# Patient Record
Sex: Female | Born: 1970 | Race: White | Hispanic: No | Marital: Married | Smoking: Never smoker
Health system: Southern US, Community
[De-identification: ages and names within clinical notes are randomized; demographics above are authoritative.]

## PROBLEM LIST (undated history)

## (undated) DIAGNOSIS — E785 Hyperlipidemia, unspecified: Secondary | ICD-10-CM

## (undated) HISTORY — PX: BREAST EXCISIONAL BIOPSY: SUR124

## (undated) HISTORY — DX: Hyperlipidemia, unspecified: E78.5

---

## 2002-05-17 ENCOUNTER — Other Ambulatory Visit: Admission: RE | Admit: 2002-05-17 | Discharge: 2002-05-17 | Payer: Self-pay | Admitting: Gynecology

## 2002-11-12 ENCOUNTER — Other Ambulatory Visit: Admission: RE | Admit: 2002-11-12 | Discharge: 2002-11-12 | Payer: Self-pay | Admitting: Gynecology

## 2003-11-26 ENCOUNTER — Other Ambulatory Visit: Admission: RE | Admit: 2003-11-26 | Discharge: 2003-11-26 | Payer: Self-pay | Admitting: Gynecology

## 2005-08-10 ENCOUNTER — Other Ambulatory Visit: Admission: RE | Admit: 2005-08-10 | Discharge: 2005-08-10 | Payer: Self-pay | Admitting: Gynecology

## 2006-08-14 ENCOUNTER — Other Ambulatory Visit: Admission: RE | Admit: 2006-08-14 | Discharge: 2006-08-14 | Payer: Self-pay | Admitting: Gynecology

## 2007-01-19 ENCOUNTER — Other Ambulatory Visit: Admission: RE | Admit: 2007-01-19 | Discharge: 2007-01-19 | Payer: Self-pay | Admitting: Gynecology

## 2007-12-17 ENCOUNTER — Inpatient Hospital Stay (HOSPITAL_COMMUNITY): Admission: AD | Admit: 2007-12-17 | Discharge: 2007-12-19 | Payer: Self-pay | Admitting: Obstetrics and Gynecology

## 2009-02-16 ENCOUNTER — Encounter: Admission: RE | Admit: 2009-02-16 | Discharge: 2009-02-16 | Payer: Self-pay | Admitting: Obstetrics and Gynecology

## 2009-04-24 ENCOUNTER — Ambulatory Visit (HOSPITAL_COMMUNITY): Admission: RE | Admit: 2009-04-24 | Discharge: 2009-04-24 | Payer: Self-pay | Admitting: Obstetrics and Gynecology

## 2010-11-17 ENCOUNTER — Encounter
Admission: RE | Admit: 2010-11-17 | Discharge: 2010-11-17 | Payer: Self-pay | Source: Home / Self Care | Attending: Obstetrics and Gynecology | Admitting: Obstetrics and Gynecology

## 2011-02-09 ENCOUNTER — Encounter (HOSPITAL_COMMUNITY)
Admission: RE | Admit: 2011-02-09 | Discharge: 2011-02-09 | Disposition: A | Discharge status: Home / Self Care | Payer: BC Managed Care – PPO | Source: Ambulatory Visit | Attending: Obstetrics and Gynecology | Admitting: Obstetrics and Gynecology

## 2011-02-09 LAB — SURGICAL PCR SCREEN: MRSA, PCR: NEGATIVE

## 2011-02-14 ENCOUNTER — Inpatient Hospital Stay (HOSPITAL_COMMUNITY)
Admission: RE | Admit: 2011-02-14 | Discharge: 2011-02-18 | DRG: 371 | Disposition: A | Discharge status: Home / Self Care | Payer: BC Managed Care – PPO | Source: Ambulatory Visit | Attending: Obstetrics and Gynecology | Admitting: Obstetrics and Gynecology

## 2011-02-14 DIAGNOSIS — Z302 Encounter for sterilization: Secondary | ICD-10-CM

## 2011-02-14 DIAGNOSIS — O99892 Other specified diseases and conditions complicating childbirth: Principal | ICD-10-CM | POA: Diagnosis present

## 2011-02-14 DIAGNOSIS — O09529 Supervision of elderly multigravida, unspecified trimester: Secondary | ICD-10-CM | POA: Diagnosis present

## 2011-02-15 LAB — CBC
HCT: 29.1 % — ABNORMAL LOW (ref 36.0–46.0)
Hemoglobin: 9.4 g/dL — ABNORMAL LOW (ref 12.0–15.0)
MCH: 28.4 pg (ref 26.0–34.0)
MCHC: 32.3 g/dL (ref 30.0–36.0)
MCV: 87.9 fL (ref 78.0–100.0)
RBC: 3.31 MIL/uL — ABNORMAL LOW (ref 3.87–5.11)

## 2011-02-20 NOTE — H&P (Signed)
  NAMEPETRINA, Tammy Powell              ACCOUNT NO.:  [id number]  MEDICAL RECORD NO.:  [id number]           PATIENT TYPE:  I  LOCATION:  9127                          FACILITY:  [facility]  PHYSICIAN:  Dineen Kid. Rana Snare, M.D.    DATE OF BIRTH:  [birth year]  DATE OF ADMISSION:  02/14/2011 DATE OF DISCHARGE:                             HISTORY & PHYSICAL   HISTORY OF PRESENT ILLNESS:  Ms. Tammy Powell is a 40 year old, G2, P2.  She is at 39+ weeks' gestational age.  She presents for primary cesarean section.  Her pregnancy has been complicated by advanced maternal age with a normal first trimester screen, declined amniocentesis.  She had normal alpha-fetoprotein.  Her estimated date of confinement is Finn 2, 2012.  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  That is why she desires primary cesarean section.  She also has multiparity and desires sterility.  PAST MEDICAL HISTORY:  Negative.  PAST SURGICAL HISTORY:  She had an episiotomy revision of the rectovaginal fistula repair.  MEDICATIONS:  Prenatal vitamins.  ALLERGIES:  She has no known drug allergies.  PHYSICAL EXAMINATION:  VITAL SIGNS:  Her blood pressure is 120/60. HEART:  Regular rate and rhythm. LUNGS:  Clear to auscultation bilaterally. ABDOMEN:  Gravid, nontender.  Cervix is fingertip, 50%, -3 station.  IMPRESSION AND PLAN:  Intrauterine pregnancy at 39+ weeks' gestational age, previous traumatic vaginal delivery with episiotomy revision, rectovaginal fistula repair, also multiparity and desires sterility.  PLAN:  Primary low segment transverse cesarean section and bilateral tubal ligation.  Risks and benefits were discussed at length.  Informed consent was obtained.     Dineen Kid Rana Snare, M.D.     DCL/MEDQ  D:  02/14/2011  T:  02/15/2011  Job:  [id number]  Electronically Signed by Candice Camp M.D. on  02/20/2011 11:31:55 AM

## 2011-02-20 NOTE — Op Note (Signed)
NAMEKILA, GODINA              ACCOUNT NO.:  [id number]  MEDICAL RECORD NO.:  [id number]           PATIENT TYPE:  I  LOCATION:  9127                          FACILITY:  [facility]  PHYSICIAN:  Dineen Kid. Rana Snare, M.D.    DATE OF BIRTH:  1970/12/04  DATE OF PROCEDURE:  02/14/2011 DATE OF DISCHARGE:                              OPERATIVE REPORT   PREOPERATIVE DIAGNOSES: 1. Intrauterine pregnancy at 52 plus weeks' gestational age. 2. Previous episiotomy revision and rectovaginal fistula repair. 3. Multiparity, desires sterility.  POSTOPERATIVE DIAGNOSES: 1. Intrauterine pregnancy at 4 plus weeks' gestational age. 2. Previous episiotomy revision and rectovaginal fistula repair. 3. Multiparity, desires sterility.  PROCEDURES: 1. Primary low segment transverse cesarean section. 2. Bilateral tubal ligation with Filshie clip.  SURGEON:  Dineen Kid. Rana Snare, MD  ANESTHESIA:  Spinal.  INDICATIONS:  Ms. Stetzer is a 40 year old G2, P1 at 57+ weeks' gestational age.  Pregnancy was uncomplicated except for the history of previous traumatic vaginal delivery, requiring repair.  She was told not to have any further vaginal trauma or vaginal deliveries and she presents for primary cesarean section for this.  She has multiparity and desires sterility.  Risks and benefits of procedure were discussed at length which include but not limited to risk of infection, bleeding, damage to the uterus, tubes, ovary, bowel, bladder, fetus, risk associated with anesthesia with blood transfusion, also tubal failure quoted 03/999.  She gives informed consent and wished to proceed.  FINDINGS:  At the time of surgery, viable female infant, Apgars of 9 and 9, pH arterial 7.16, weight was 8 pounds and 14 ounces.  ESTIMATED BLOOD LOSS:  700 mL.  DESCRIPTION OF PROCEDURE:  After adequate analgesia, the patient was placed in the supine position with left lateral tilt.  She was sterilely prepped and draped.  Bladder was  sterilely drained with a Foley catheter.  A Pfannenstiel skin incision was made 2 fingerbreadths above pubic symphysis, taken down sharply to fascia which was incised transversely and extended superiorly and inferiorly off the bellies of rectus muscle which were separated sharply in the midline.  Peritoneum was entered sharply.  Bladder flap was created and placed behind the bladder blade.  A low-segment myotomy incision was made down to the amniotic sac, extended laterally with the operator's fingertips. Amniotomy was performed and the infant's vertex was delivered atraumatically.  The nares and pharynx were suctioned.  Infant delivered, cord clamped and cut, and handed to the pediatrician for resuscitation.  Cord blood was obtained.  Placenta extracted manually. Uterus was exteriorized, wiped clean with a dry lap.  The myotomy incision was closed in 2 layers, first being a running locking layer, the second being imbricating layer of 0 Monocryl suture.  Uterus was placed back in the abdominal cavity.  Midline incision line was encountered and a figure-of-eight of 0 Monocryl suture was placed.  Good hemostasis was noted. Bilateral fallopian tubes were identified by the fimbriated ends and a midportion of the tube was grasped and a filshie clip was place across the tubes with good placement noted.  This was done bilateral with good results noted.  After copious amount of irrigation, adequate hemostasis was assured.  The peritoneum was closed with 0 Monocryl suture.  Rectus muscle was plicated in the midline.  Irrigation was applied and after adequate hemostasis, the fascia was closed with a #1 Vicryl in a running fashion.  Irrigation was applied.  After adequate hemostasis, skin stapled, Steri-Strips applied.  The patient tolerated the procedure well and was stable on transfer to recovery room.  Sponge and instrument count was normal x3.  Estimated blood loss 700 mL.  The patient received 1  g of cefotetan preoperatively.     Dineen Kid Rana Snare, M.D.     DCL/MEDQ  D:  02/14/2011  T:  02/15/2011  Job:  [id number]  Electronically Signed by Candice Camp M.D. on 02/20/2011 11:33:40 AM

## 2011-02-21 ENCOUNTER — Inpatient Hospital Stay (HOSPITAL_COMMUNITY): Admission: AD | Admit: 2011-02-21 | Payer: Self-pay | Admitting: Obstetrics and Gynecology

## 2011-02-28 LAB — CBC
HCT: 40.8 % (ref 36.0–46.0)
Hemoglobin: 14.4 g/dL (ref 12.0–15.0)
MCV: 88.2 fL (ref 78.0–100.0)
Platelets: 309 10*3/uL (ref 150–400)
RDW: 12.6 % (ref 11.5–15.5)

## 2011-02-28 LAB — PREGNANCY, URINE: Preg Test, Ur: NEGATIVE

## 2011-04-05 NOTE — Op Note (Signed)
Tammy Powell, Tammy Powell              ACCOUNT NO.:  [id number]   MEDICAL RECORD NO.:  [id number]          PATIENT TYPE:  AMB   LOCATION:  [facility]C                           FACILITY:  [facility]   PHYSICIAN:  Dineen Kid. Rana Snare, M.D.    DATE OF BIRTH:  [birth year]   DATE OF PROCEDURE:  04/24/2009  DATE OF DISCHARGE:                               OPERATIVE REPORT   PREOPERATIVE DIAGNOSES:  Pelvic pain, potential rectovaginal fistula and  attenuated levator muscles.   POSTOPERATIVE DIAGNOSES:  Pelvic pain, potential rectovaginal fistula  and attenuated levator muscles.   PROCEDURE:  Episiotomy revision with posterior colporrhaphy and  perineorrhaphy.   SURGEON:  Dineen Kid. Rana Snare, MD   ANESTHESIA:  General endotracheal.   INDICATIONS:  Tammy Powell is a 40 year old, G1, P1, status post vaginal  delivery a little over a year ago with a fourth-degree episiotomy.  Over  the last year, she has continued to have pelvic pain to the point that  it precludes intercourse.  She also describes air coming from the  vagina, but no evidence of stool.  She underwent radiological evaluation  in search of a rectovaginal fistula which did not show any evidence of  rectovaginal fistula.  She also has poor rectal levator muscular support  of the rectum.  She desires definitive surgical intervention.  Plan to  proceed with episiotomy revision, possible excision of rectovaginal  fistula, and perineorrhaphy.  The risks and benefits of the procedure  were discussed at length which include but not limited to risk of  infection, bleeding, damage to the bowel, bladder, rectum, perineum,  vagina, ovaries, uterus.  Possibly, this may not alleviate the pain, it  could recur or worsen, we may not be able to identify the facial, it  could recur or worsen, risks associated with anesthesia and blood  transfusion were also discussed, and she gives her informed consent.   FINDINGS AT TIME OF SURGERY:  No evidence of rectovaginal fistula.   She  does have very attenuated rectal muscles and also support of the rectum  consistent with a rectocele.   DESCRIPTION OF PROCEDURE:  After adequate analgesia, the patient was  placed in the dorsal lithotomy position.  She was sterilely prepped and  draped.  Bladder sterilely drained.  The perineum was grasped with 2  Allis clamps at the edge of the introitus.  Triangular flap of skin was  incised and removed from the perineum.  The posterior vaginal mucosa was  undermined with Metzenbaum scissors and dissected approximately 4 cm up  into the vagina to the end of the scar tissue where the episiotomy and  the tissue was reflected laterally removing the previous scar tissue and  achieving hemostasis with Bovie cautery.  The operator's left finger was  placed in the rectum and careful examination of the rectal mucosa while  pushing it into the vagina did not show any evidence of fistula or  disruption of the mucosa.  It was very attenuated and thin with very  poor perirectal support.  A 4-0 Vicryl Rapide was used to plicate the  perirectal fascia in a  pursestring fashion over the rectum.  This was  done with multiple pursestrings of 4-0 Vicryl Rapide giving good  perirectal and rectal mucosal support.  The rectal fascia was then  plicated in the midline using 0 Monocryl suture figure-of-eights.  The  levator muscle and the superficial transverse perinei muscle were  identified and reapproximated with 2 sutures of 0 Monocryl suture with  good support noted.  The excess vaginal mucosa was trimmed from the  posterior rectum.  The vagina was then closed with a 2-0 running suture  of Vicryl Rapide with good approximation and good support noted.  The  perineum was then closed after plicating the superficial transverse  perinei muscle with the 0 Monocryl suture.  The perineal skin was then  closed with a 2-0 Vicryl Rapide.  Good approximation and good support  noted.  Reexamination with the  operator's fingers revealed good  posterior support.  No evidence of contraction of the scar tissue.  Rectovaginal reveals good rectal tone and no evidence of sutures into  the rectum.  The patient was then transferred to the recovery room in  stable condition.  Sponge and needle count was normal x3.  Estimated  blood loss was minimal.  The patient received 1 g of cefotetan  preoperatively.   DISPOSITION:  The patient will be discharged home to follow up in the  office 2-3 weeks with a routine instruction sheet.  Told to return for  increased pain, fever, or bleeding.  She has a prescription of Vicodin  at home, to use ice for the next 24-48 hours.      Dineen Kid Rana Snare, M.D.  Electronically Signed     DCL/MEDQ  D:  04/24/2009  T:  04/24/2009  Job:  [id number]

## 2011-08-12 LAB — CBC
Hemoglobin: 13.8
MCHC: 35
MCHC: 35.1
MCV: 86.9
Platelets: 225
Platelets: 267
RDW: 14.2
WBC: 14.6 — ABNORMAL HIGH

## 2019-01-03 ENCOUNTER — Other Ambulatory Visit: Payer: Self-pay | Admitting: Obstetrics and Gynecology

## 2019-01-03 DIAGNOSIS — N6320 Unspecified lump in the left breast, unspecified quadrant: Secondary | ICD-10-CM

## 2019-01-11 ENCOUNTER — Ambulatory Visit
Admission: RE | Admit: 2019-01-11 | Discharge: 2019-01-11 | Disposition: A | Discharge status: Home / Self Care | Payer: BC Managed Care – PPO | Source: Ambulatory Visit | Attending: Obstetrics and Gynecology | Admitting: Obstetrics and Gynecology

## 2019-01-11 ENCOUNTER — Other Ambulatory Visit: Payer: Self-pay | Admitting: Obstetrics and Gynecology

## 2019-01-11 DIAGNOSIS — R2232 Localized swelling, mass and lump, left upper limb: Secondary | ICD-10-CM

## 2019-01-11 DIAGNOSIS — N6320 Unspecified lump in the left breast, unspecified quadrant: Secondary | ICD-10-CM

## 2019-01-18 ENCOUNTER — Other Ambulatory Visit: Payer: Self-pay | Admitting: Obstetrics and Gynecology

## 2019-01-18 ENCOUNTER — Ambulatory Visit
Admission: RE | Admit: 2019-01-18 | Discharge: 2019-01-18 | Disposition: A | Discharge status: Home / Self Care | Payer: BC Managed Care – PPO | Source: Ambulatory Visit | Attending: Obstetrics and Gynecology | Admitting: Obstetrics and Gynecology

## 2019-01-18 DIAGNOSIS — R2232 Localized swelling, mass and lump, left upper limb: Secondary | ICD-10-CM

## 2019-10-09 ENCOUNTER — Other Ambulatory Visit: Payer: Self-pay

## 2019-10-09 DIAGNOSIS — Z20822 Contact with and (suspected) exposure to covid-19: Secondary | ICD-10-CM

## 2019-10-10 LAB — NOVEL CORONAVIRUS, NAA: SARS-CoV-2, NAA: NOT DETECTED

## 2019-12-23 IMAGING — US US AXILLARY NODE CORE BIOPSY LEFT
1 series · 12 of 12 positions shown · non-contrast
Comparison: Previous exam(s).
COMPARISON: Previous exam(s).
COMPARISON: Previous exam(s).

Addendum:
CLINICAL DATA: Ultrasound-guided biopsy was recommended of 2 areas
within the patient's palpable left axillary mass.

This report describes the biopsy of the more superior component of
the mass.
EXAM:
US AXILLARY NODE CORE BIOPSY LEFT

[Series 1: us axillary node core biopsy left · 0.05mm/px · 12 of 12 slices shown]
[im 1/12]
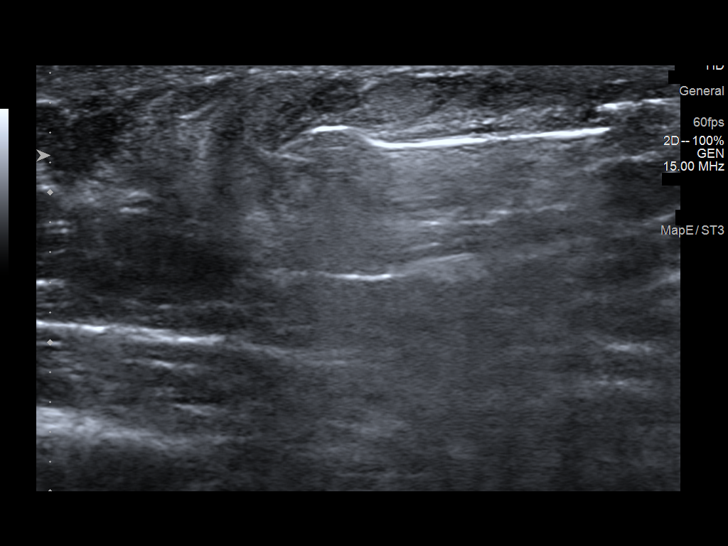
[im 2/12]
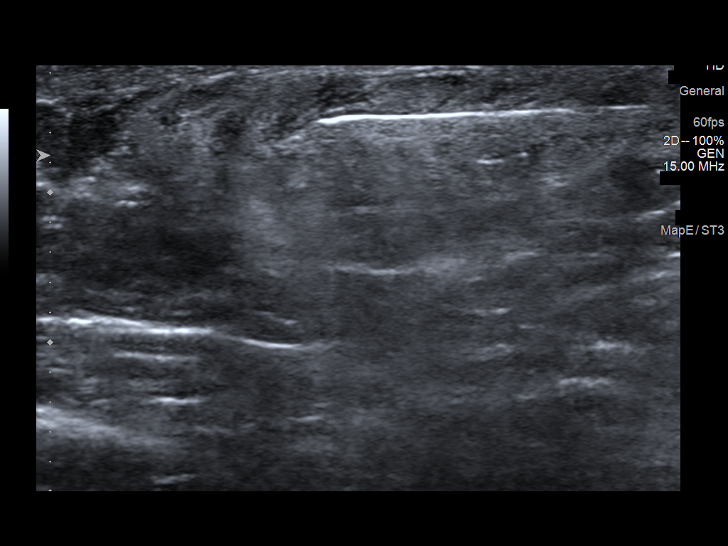
[im 3/12]
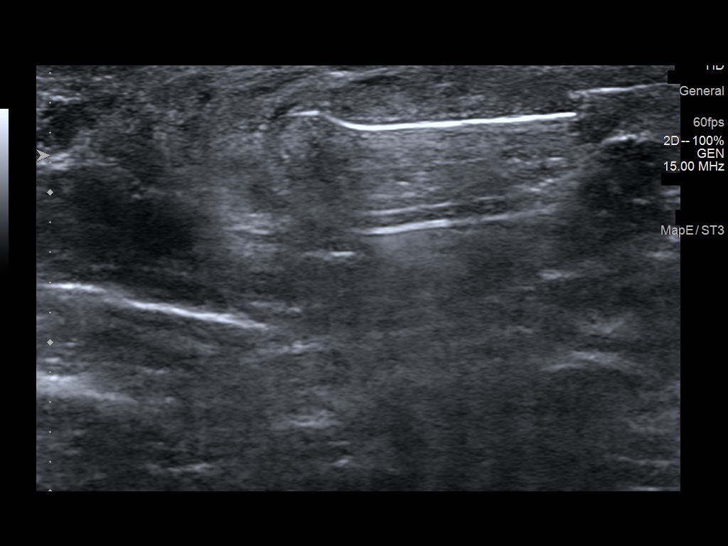
[im 4/12]
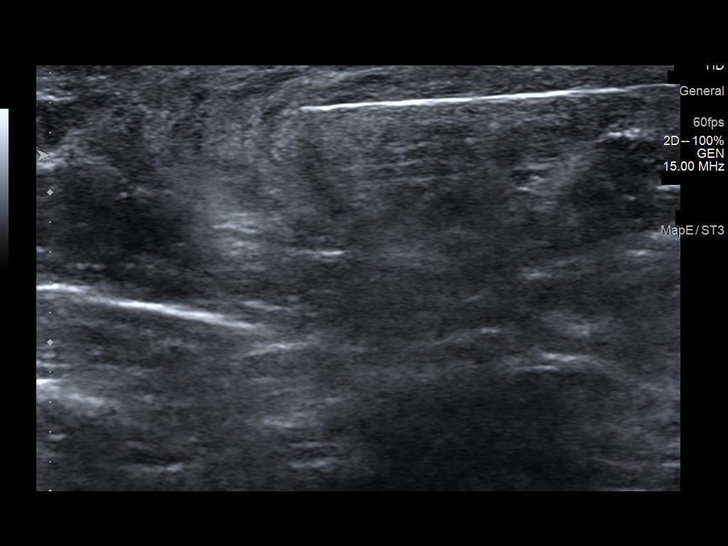
[im 5/12]
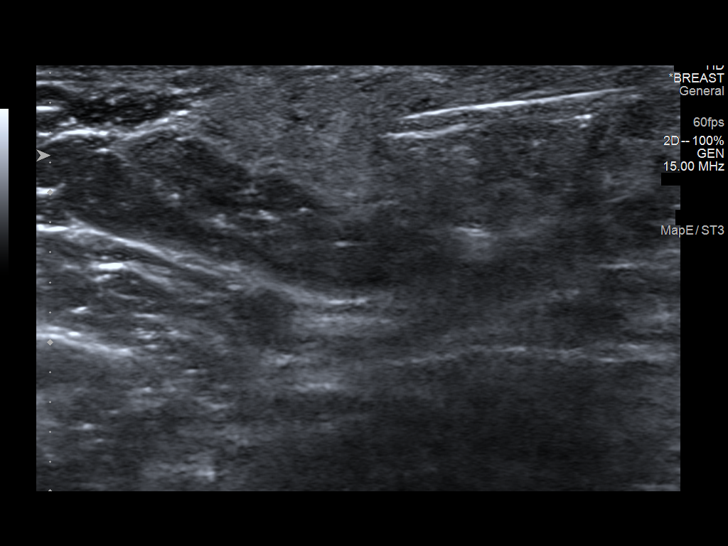
[im 6/12]
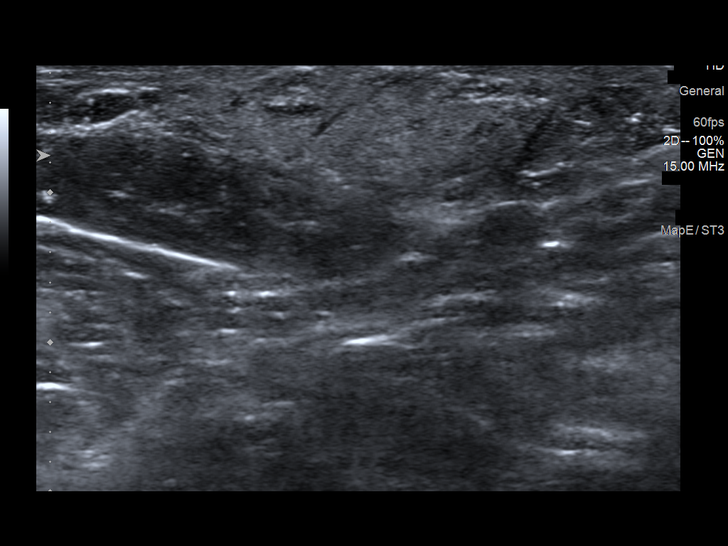
[im 7/12]
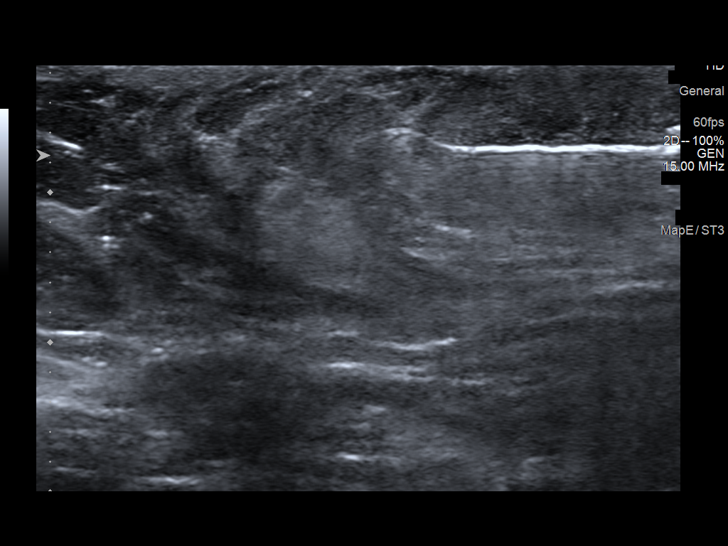
[im 8/12]
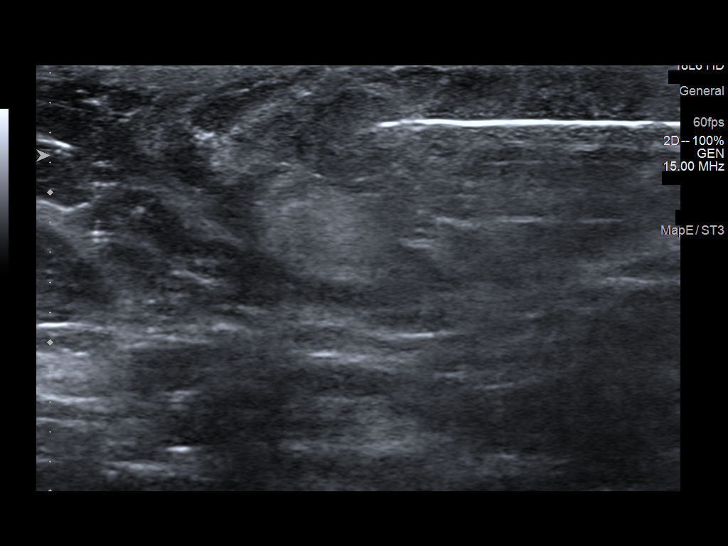
[im 9/12]
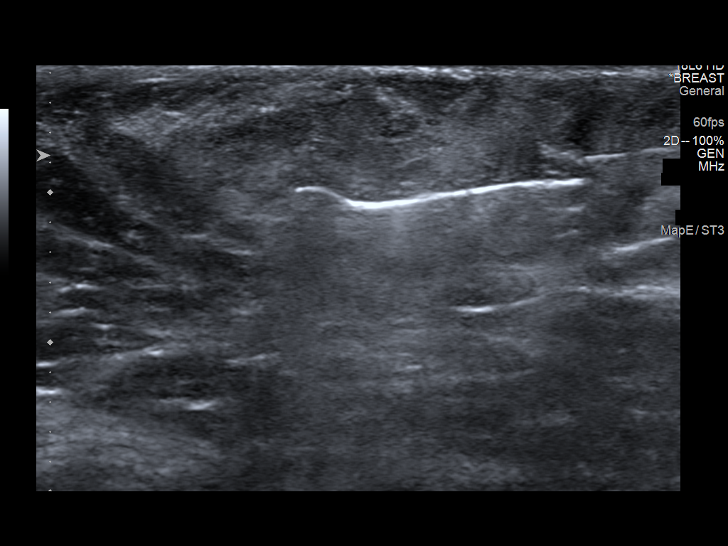
[im 10/12]
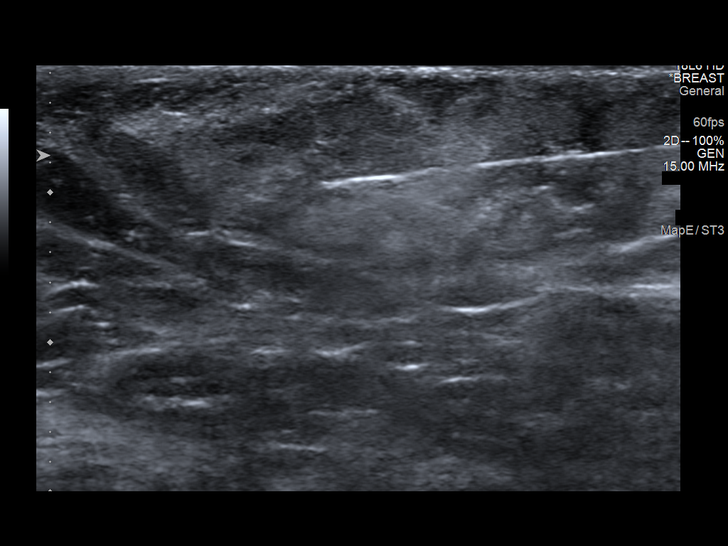
[im 11/12]
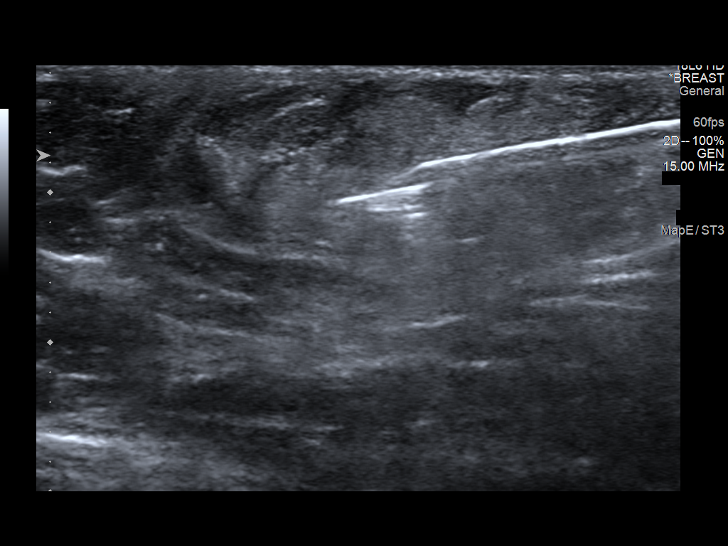
[im 12/12]
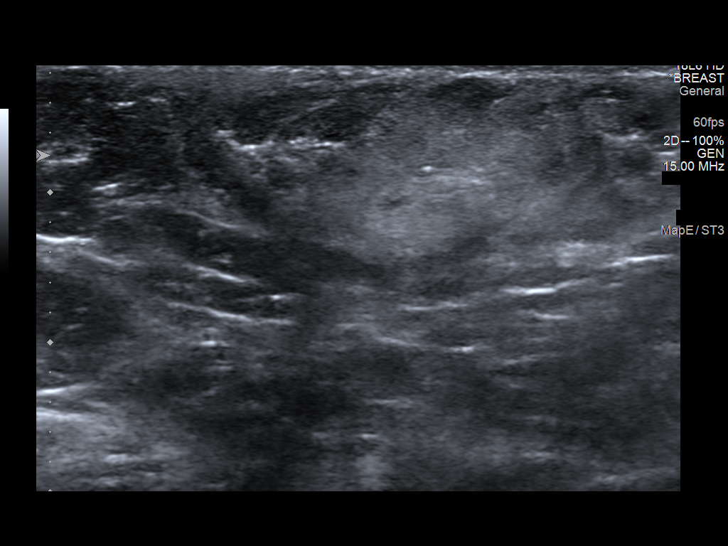

[12 of 12 positions shown; findings below may reference images not displayed]



Using sterile technique and 1% Lidocaine as local anesthetic, under
direct ultrasound visualization, a 14 gauge Jumper device was
used to perform biopsy of the superior portion of the palpable left
axillary mass using a lateral approach. At the conclusion of the
procedure a HydroMARK tissue marker clip was deployed into the
biopsy cavity.
IMPRESSION: Ultrasound guided biopsy of left axilla (superior portion of the
mass). No apparent complications.

ADDENDUM:
Pathology revealed BENIGN BREAST TISSUE WITH FOCAL PSEUDOANGIOMATOUS
STROMAL HYPERPLASIA (PASH) of the mid Left axilla (superior portion
of mass).

BENIGN ADIPOSE TISSUE of the mid Left axilla (inferior portion of
mass). This was found to be concordant by Dr. Nomasibulele Moatshe.

Pathology results were discussed with the patient by telephone. The
patient reported doing well after the biopsies with tenderness,
bruising and minimal bleeding at the sites. Post biopsy instructions
and care were reviewed and questions were answered. The patient was
encouraged to call The [REDACTED] for any
additional concerns.

The patient was instructed to return for annual screening

Pathology results reported by Zvjezdanam Nemanic, RN on 01/21/2019.

*** End of Addendum ***
Addendum:
FINDINGS: I met with the patient and we discussed the procedure of
ultrasound-guided biopsy, including benefits and alternatives. We
discussed the high likelihood of a successful procedure. We
discussed the risks of the procedure, including infection, bleeding,
tissue injury, clip migration, and inadequate sampling. Informed
written consent was given. The usual time-out protocol was performed
immediately prior to the procedure.

Using sterile technique and 1% Lidocaine as local anesthetic, under
direct ultrasound visualization, a 14 gauge Jumper device was
used to perform biopsy of the superior portion of the palpable left
axillary mass using a lateral approach. At the conclusion of the
procedure a HydroMARK tissue marker clip was deployed into the
biopsy cavity.
IMPRESSION: Ultrasound guided biopsy of left axilla (superior portion of the
mass). No apparent complications.

ADDENDUM:
Pathology revealed BENIGN BREAST TISSUE WITH FOCAL PSEUDOANGIOMATOUS
STROMAL HYPERPLASIA (PASH) of the mid Left axilla (superior portion
of mass).

BENIGN ADIPOSE TISSUE of the mid Left axilla (inferior portion of
mass). This was found to be concordant by Dr. Nomasibulele Moatshe.

Pathology results were discussed with the patient by telephone. The
patient reported doing well after the biopsies with tenderness,
bruising and minimal bleeding at the sites. Post biopsy instructions
and care were reviewed and questions were answered. The patient was
encouraged to call The [REDACTED] for any
additional concerns.

The patient was instructed to return for annual screening

Pathology results reported by Zvjezdanam Nemanic, RN on 01/21/2019.



Using sterile technique and 1% Lidocaine as local anesthetic, under
direct ultrasound visualization, a 14 gauge Jumper device was
used to perform biopsy of the superior portion of the palpable left
axillary mass using a lateral approach. At the conclusion of the
procedure a HydroMARK tissue marker clip was deployed into the
biopsy cavity.
IMPRESSION: Ultrasound guided biopsy of left axilla (superior portion of the
mass). No apparent complications.

## 2020-01-18 ENCOUNTER — Ambulatory Visit: Payer: Self-pay | Attending: Internal Medicine

## 2020-01-18 DIAGNOSIS — Z23 Encounter for immunization: Secondary | ICD-10-CM | POA: Insufficient documentation

## 2020-01-18 NOTE — Progress Notes (Signed)
   Covid-19 Vaccination Clinic  Name:  Tammy Powell    MRN: [id number] DOB: Aug 02, 1971  01/18/2020  [name] was observed post Covid-19 immunization for 15 minutes without incidence. She was provided with Vaccine Information Sheet and instruction to access the V-Safe system.   [name] was instructed to call 911 with any severe reactions post vaccine: Marland Kitchen Difficulty breathing  . Swelling of your face and throat  . A fast heartbeat  . A bad rash all over your body  . Dizziness and weakness    Immunizations Administered    Name Date Dose VIS Date Route   Pfizer COVID-19 Vaccine 01/18/2020  9:48 AM 0.3 mL 11/01/2019 Intramuscular   Manufacturer: [employer], [employer]   Lot: [id number]   NDC: [address]-3

## 2020-02-08 ENCOUNTER — Ambulatory Visit: Payer: Self-pay | Attending: Internal Medicine

## 2020-02-08 DIAGNOSIS — Z23 Encounter for immunization: Secondary | ICD-10-CM

## 2020-02-08 NOTE — Progress Notes (Signed)
   Covid-19 Vaccination Clinic  Name:  Tammy Powell    MRN: [id number] DOB: 09/27/1971  02/08/2020  [name] was observed post Covid-19 immunization for 15 minutes without incident. She was provided with Vaccine Information Sheet and instruction to access the V-Safe system.   [name] was instructed to call 911 with any severe reactions post vaccine: Marland Kitchen Difficulty breathing  . Swelling of face and throat  . A fast heartbeat  . A bad rash all over body  . Dizziness and weakness   Immunizations Administered    Name Date Dose VIS Date Route   Pfizer COVID-19 Vaccine 02/08/2020 12:23 PM 0.3 mL 11/01/2019 Intramuscular   Manufacturer: [employer], [employer]   Lot: [id number]   NDC: [address]-3

## 2020-02-12 ENCOUNTER — Ambulatory Visit: Payer: Self-pay

## 2020-04-01 ENCOUNTER — Other Ambulatory Visit: Payer: Self-pay | Admitting: Internal Medicine

## 2020-04-01 DIAGNOSIS — Z8249 Family history of ischemic heart disease and other diseases of the circulatory system: Secondary | ICD-10-CM

## 2020-04-09 ENCOUNTER — Ambulatory Visit: Payer: Self-pay

## 2020-04-28 ENCOUNTER — Ambulatory Visit
Admission: RE | Admit: 2020-04-28 | Discharge: 2020-04-28 | Disposition: A | Discharge status: Home / Self Care | Payer: Self-pay | Source: Ambulatory Visit | Attending: Internal Medicine | Admitting: Internal Medicine

## 2020-04-28 DIAGNOSIS — Z8249 Family history of ischemic heart disease and other diseases of the circulatory system: Secondary | ICD-10-CM

## 2021-04-02 IMAGING — CT CT CARDIAC CORONARY ARTERY CALCIUM SCORE
3 series · 14 of 20 positions shown, 16 images · non-contrast
Comparison: None.

CLINICAL DATA: Family history

EXAM:
CT CARDIAC CORONARY ARTERY CALCIUM SCORE
TECHNIQUE: Non-contrast imaging through the heart was performed using
prospective ECG gating. Image post processing was performed on an
independent workstation, allowing for quantitative analysis of the
heart and coronary arteries. Note that this exam targets the heart
and the chest was not imaged in its entirety.

[Series 2: calcium scoring 2.00 qr36 bestdiast 70% hrt calciu · axial · 0.34mm/px · z∈[+1489,+1585]mm · 4 of 80 slices shown]
[im 16/80  vessel]
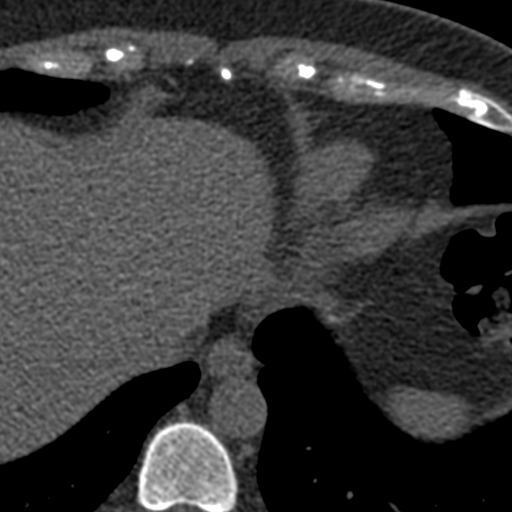
[im 32/80  vessel]
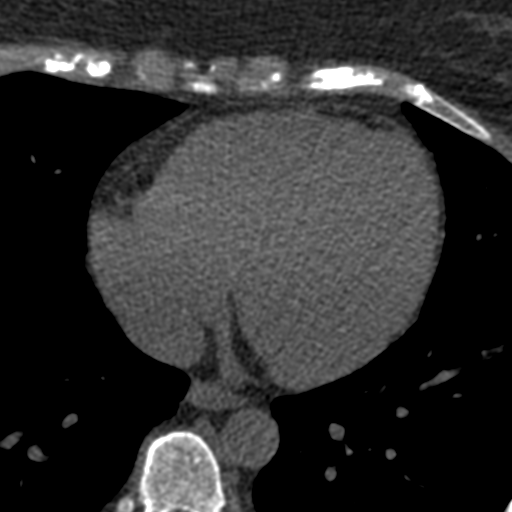
[im 48/80  vessel]
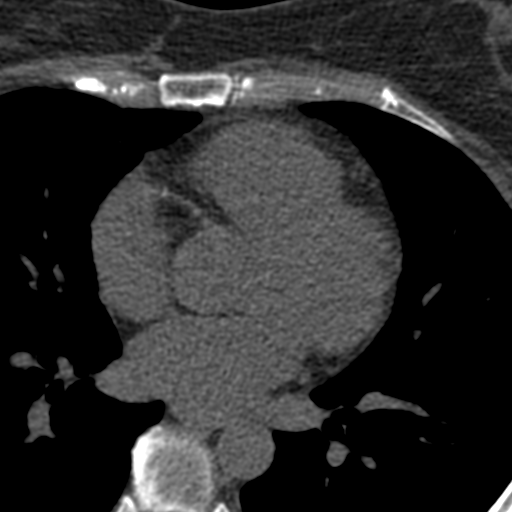
[im 64/80  vessel]
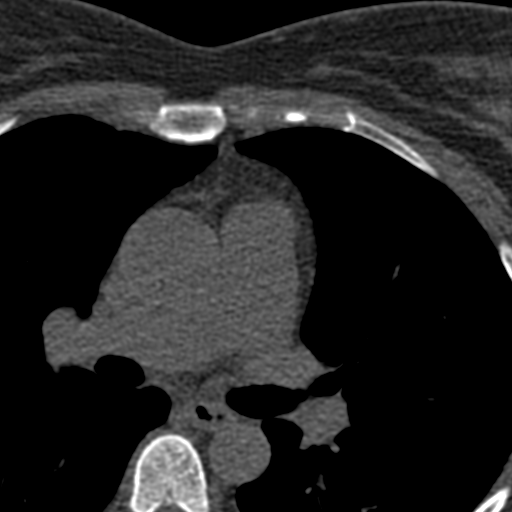

[Series 3: calcium scoring 2.00 br40 bestdiast 70% axial · axial · 0.51mm/px · z∈[+1485,+1589]mm · 5 of 80 slices shown, 7 images]
[im 14/80  vessel]
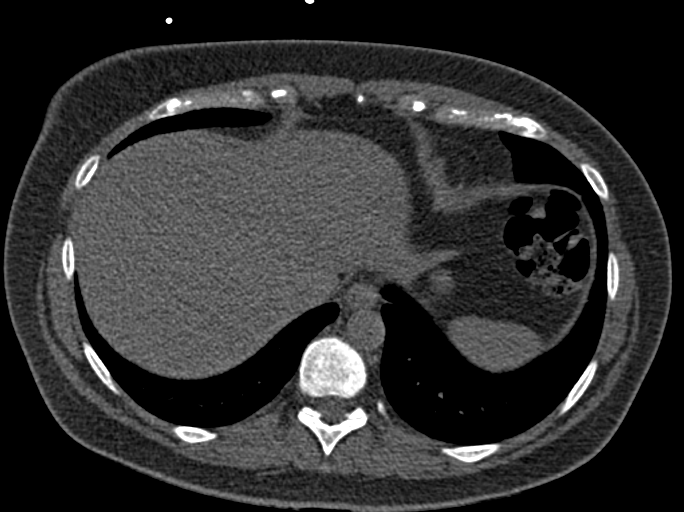
[im 14/80  lung]
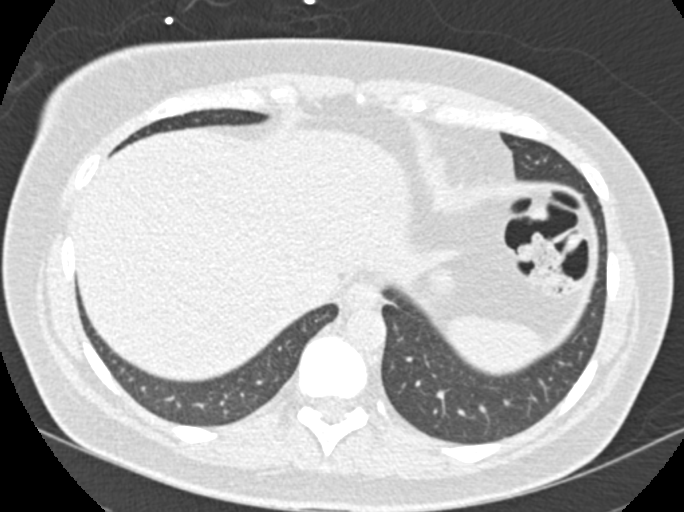
[im 27/80  vessel]
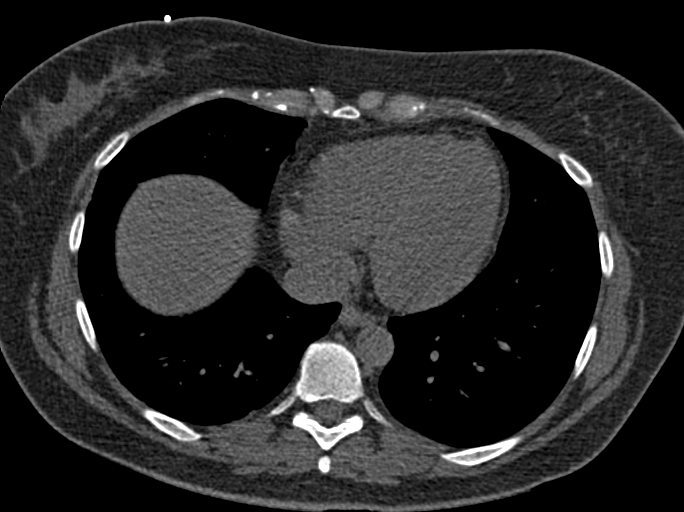
[im 40/80  vessel]
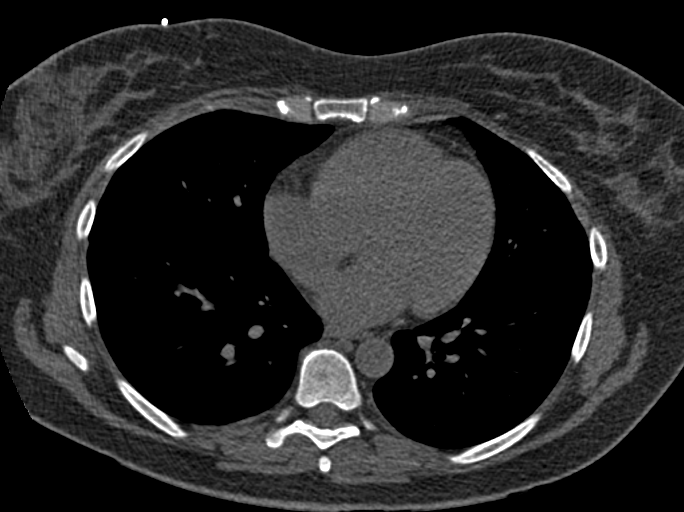
[im 53/80  vessel]
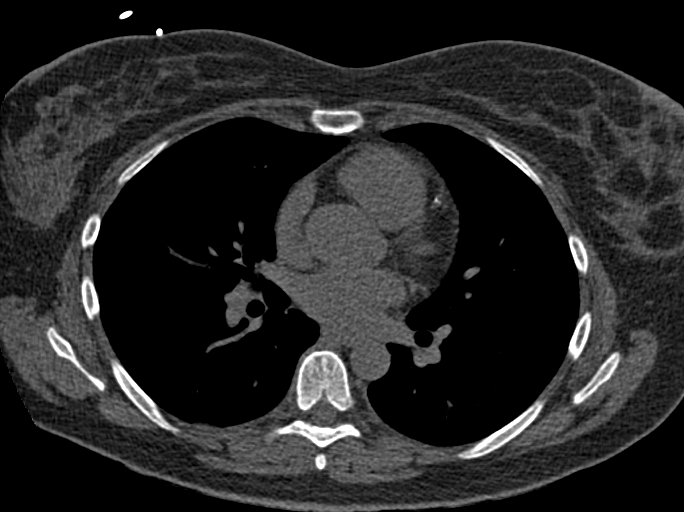
[im 66/80  vessel]
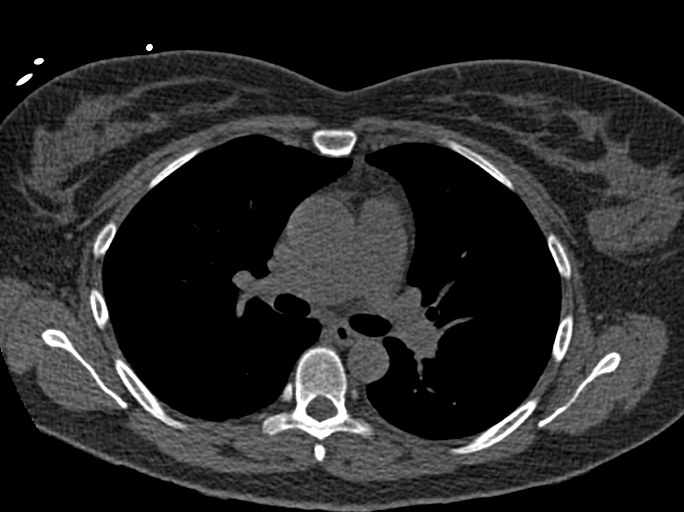
[im 66/80  lung]
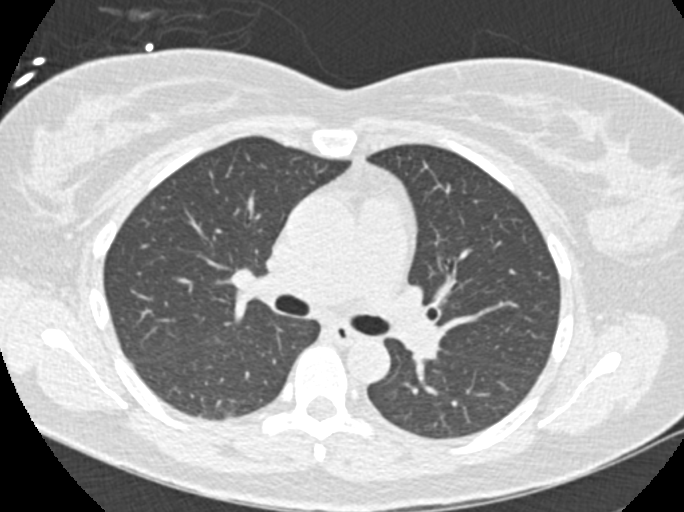

[Series 9: calcium scoring 2.00 br60 bestdiast 70% lungs · axial · 0.48mm/px · z∈[+1485,+1589]mm · 5 of 80 slices shown]
[im 14/80  vessel]
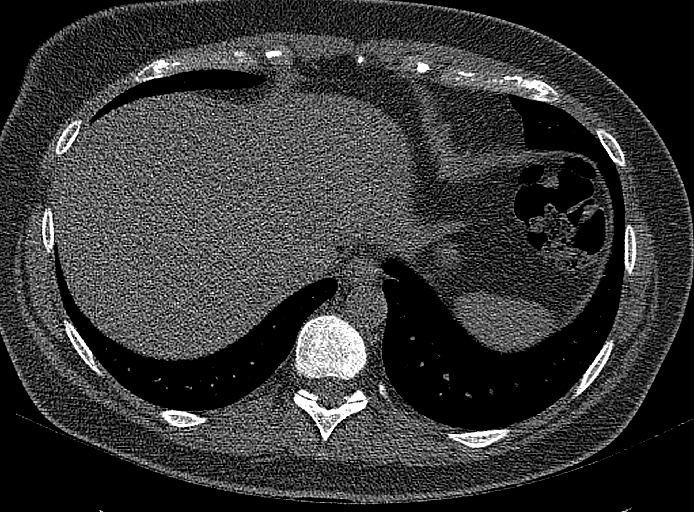
[im 27/80  vessel]
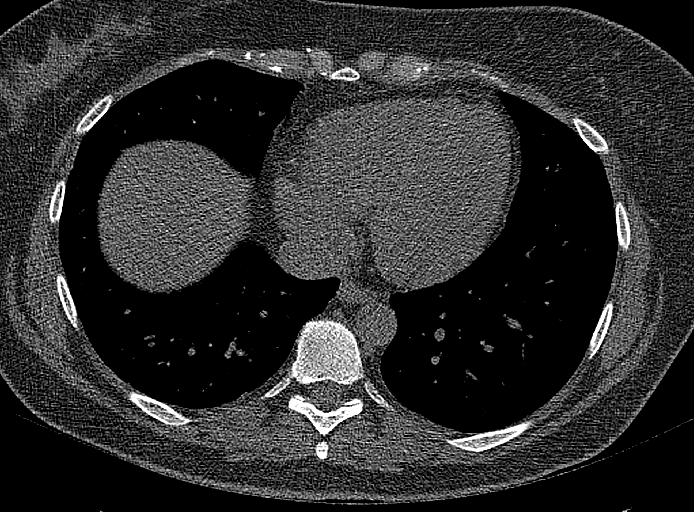
[im 40/80  vessel]
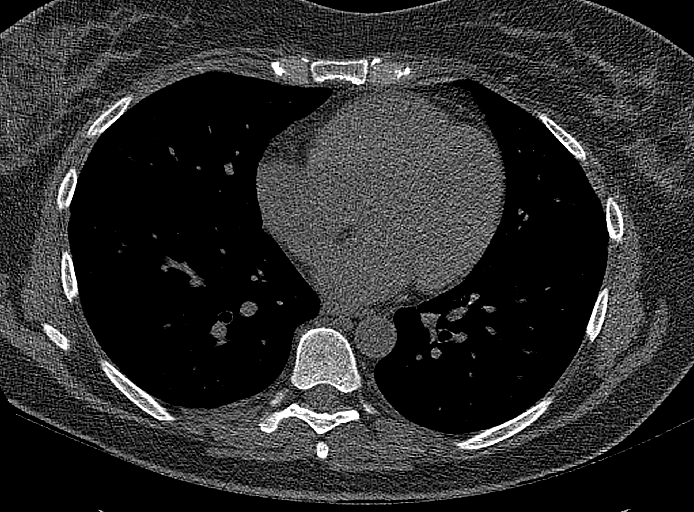
[im 53/80  vessel]
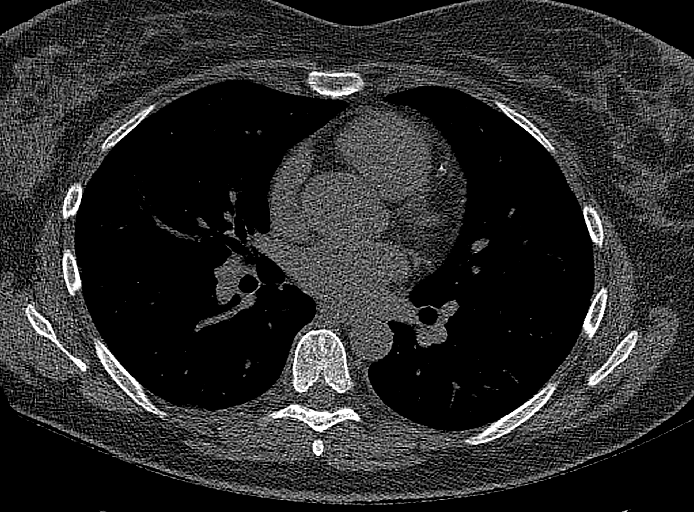
[im 66/80  vessel]
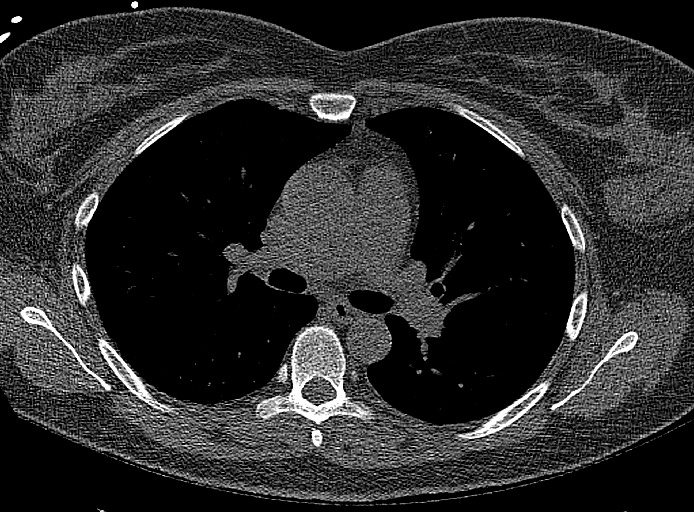

[14 of 20 positions shown; findings below may reference images not displayed]

FINDINGS: CORONARY CALCIUM SCORES:

Left Main:

LAD:

LCx: 0

RCA: 0

Total Agatston Score:

[HOSPITAL] percentile: 99

AORTA MEASUREMENTS:

Ascending Aorta: 32 mm

Descending Aorta: 18 mm

OTHER FINDINGS:

Heart is normal size. No adenopathy. No confluent airspace opacities
or effusions. Imaging into the upper abdomen shows no acute
findings. Chest wall soft tissues are unremarkable. No acute bony
abnormality.
IMPRESSION: The observed calcium score of 94.7 is at the percentile 99 for
subjects of the same age, gender and race/ethnicity who are free of
clinical cardiovascular disease and treated diabetes.

No acute or significant extracardiac abnormality

## 2022-09-19 ENCOUNTER — Telehealth: Payer: Self-pay | Admitting: Genetic Counselor

## 2022-09-19 NOTE — Telephone Encounter (Signed)
Scheduled appt per 10/30 referral. Pt is aware of appt date and time. Pt is aware to arrive 15 mins prior to appt time and to bring and updated insurance card. Pt is aware of appt location.   

## 2022-10-11 ENCOUNTER — Other Ambulatory Visit: Payer: Self-pay | Admitting: General Surgery

## 2022-11-24 ENCOUNTER — Encounter: Payer: Self-pay | Admitting: Genetic Counselor

## 2022-11-24 ENCOUNTER — Inpatient Hospital Stay: Payer: BC Managed Care – PPO | Attending: Genetic Counselor | Admitting: Genetic Counselor

## 2022-11-24 ENCOUNTER — Inpatient Hospital Stay: Payer: BC Managed Care – PPO

## 2022-11-24 DIAGNOSIS — Z8041 Family history of malignant neoplasm of ovary: Secondary | ICD-10-CM

## 2022-11-24 DIAGNOSIS — Z8042 Family history of malignant neoplasm of prostate: Secondary | ICD-10-CM

## 2022-11-24 DIAGNOSIS — Z803 Family history of malignant neoplasm of breast: Secondary | ICD-10-CM | POA: Diagnosis not present

## 2022-11-24 NOTE — Progress Notes (Signed)
REFERRING PROVIDER: Rolm Bookbinder, MD [address] [place],  [place] [address]  PRIMARY PROVIDER:  Shon Baton, MD  PRIMARY REASON FOR VISIT:  Encounter Diagnoses  Name Primary?   Family history of ovarian cancer Yes   Family history of breast cancer    Family history of prostate cancer      HISTORY OF PRESENT ILLNESS:   Ms. Newmann, a 52 y.o. female, was seen for a [place] cancer genetics consultation at the request of Dr. Donne Hazel due to a family history of breast and ovarian cancer.  Ms. Patella presents to clinic today to discuss the possibility of a hereditary predisposition to cancer, to discuss genetic testing, and to further clarify her future cancer risks, as well as potential cancer risks for family members.   Ms. Siebers is a 52 y.o. female with no personal history of cancer.  In November 2023, she had an excision of a left axillary mass, which was benign.  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   CANCER HISTORY:  Oncology History   No history exists.    RISK FACTORS:  Mammogram within the last year: yes Colonoscopy: yes;  approx 2 years ago; two colon polyps; return in 5 years . Hysterectomy: no.  Ovaries intact: yes.  Menarche was at age 42.  First live birth at age 31. Menopausal status: perimenopausal.  OCP use for approximately  more than 20 years  years.  Dermatology screening: scheduled first appt for this motn    Past Surgical History:  Procedure Laterality Date   BREAST EXCISIONAL BIOPSY Right      FAMILY HISTORY:  We obtained a detailed, 4-generation family history.  Significant diagnoses are listed below: Family History  Problem Relation Age of Onset   Skin cancer Maternal Aunt        unknown type; surgery only   Skin cancer Maternal Uncle        unknown type; surgery only   Breast cancer Paternal Aunt 65   Prostate cancer Paternal Uncle         mets; dx late 32s   Ovarian cancer Paternal Grandmother        dx 39s     Ms. Lewing is unaware of previous family history of genetic testing for hereditary cancer risks. There is no reported Ashkenazi Jewish ancestry. There is no known consanguinity.  GENETIC COUNSELING ASSESSMENT: Ms. Diener is a 52 y.o. female with a family history which is somewhat suggestive of a hereditary cancer syndrome and predisposition to cancer given the presence of related cancers in her paternal family (breast, ovarian, metastatic prostate). We, therefore, discussed and recommended the following at today's visit.   DISCUSSION: We discussed that 5 - 10% of cancer is hereditary, with most cases of hereditary hereditary breast cancer associated with mutations in BRCA1/2.  There are other genes that can be associated with hereditary breast, ovarian, or prostate cancer syndromes.  We discussed that testing is beneficial for several reasons, including knowing about other cancer risks, identifying potential screening and risk-reduction options that may be appropriate, and to understanding if other family members could be at risk for cancer and allowing them to undergo genetic testing.  We reviewed the characteristics, features and inheritance patterns of hereditary cancer syndromes. We also discussed genetic testing, including the appropriate family members to test, the process of testing, insurance coverage and turn-around-time for results. We discussed the implications of a negative, positive,  and variant of uncertain significant result. We discussed that negative results would be uninformative given that Ms. Vantine does not have a personal history of cancer. We recommended Ms. Holdren pursue genetic testing for a panel that contains genes associated with breast, ovarian, and prostate cancer.  Based on Ms. Deboy's family history of cancer, she meets medical criteria for genetic testing.   We discussed the  Genetic Information Non-Discrimination Act (GINA) of 2008, which helps protect individuals against genetic discrimination based on their genetic test results.  It impacts both health insurance and employment.  With health insurance, it protects against genetic test results being used for increased premiums or policy termination. For employment, it protects against hiring, firing and promoting decisions based on genetic test results.  GINA does not apply to those in the [employer], those who work for companies with less than 15 employees, and new life insurance or long-term disability insurance policies.  Health status due to a cancer diagnosis is not protected under GINA.  We discussed that, based on available information, her estimated lifetime risk for breast cancer according to the Harriett Rush risk model is 15.9%, which is elevated compared to the general population but not considered 'high risk' for breast cancer at this time.  This risk estimate can change over time and may be repeated to reflect new information in her personal or family history in the future.           PLAN: Ms. Hehr did not wish to pursue genetic testing at today's visit.  She wishes to speak with her husband about GINA considerations prior to proceeding. We understand this decision and remain available to coordinate genetic testing at any time in the future. We, therefore, recommend Ms. Auld continue to follow the cancer screening guidelines given by her primary healthcare provider.  Based on Ms. Tailor's family history, we recommended her father and paternal aunt (with a breast cancer history) have genetic counseling and testing. Ms. Witts will let us know if we can be of any assistance in coordinating genetic counseling and/or testing for these family members.   Ms. Welke questions were answered to her satisfaction today. Our contact information was provided should additional questions or concerns arise.  Thank you for the referral and allowing [place] to share in the care of your patient.   Tiwanna Tuch M. Joette Catching, [place], [facility] Genetic Counselor [email or web address] (P) [phone]   The patient was seen for a total of 35 minutes in face-to-face genetic counseling.  The patient was seen alone.  Drs. Lindi Adie and/or Burr Medico were available to discuss this case as needed.  _______________________________________________________________________ For Office Staff:  Number of people involved in session: 1 Was an Intern/ student involved with case: no

## 2022-11-25 ENCOUNTER — Encounter: Payer: Self-pay | Admitting: Genetic Counselor

## 2022-12-10 NOTE — Therapy (Signed)
OUTPATIENT PHYSICAL THERAPY ONCOLOGY EVALUATION  Patient Name: Tammy Powell MRN: [id number] DOB:1971-07-24, 52 y.o., female Today's Date: 12/12/2022  END OF SESSION:  PT End of Session - 12/12/22 1451     Visit Number 1    Number of Visits 12    Date for PT Re-Evaluation 01/23/23    PT Start Time 1401    PT Stop Time 1450    PT Time Calculation (min) 49 min    Activity Tolerance Patient tolerated treatment well    Behavior During Therapy [facility] for tasks assessed/performed             History reviewed. No pertinent past medical history. Past Surgical History:  Procedure Laterality Date   BREAST EXCISIONAL BIOPSY Right    There are no problems to display for this patient.   PCP: Creola Corn, MD  REFERRING PROVIDER: Emelia Loron, MD  REFERRING DIAG: Cording and limitations in function  THERAPY DIAG:  Axillary mass, left  Family history of breast cancer  ONSET DATE: 10/11/2022  Rationale for Evaluation and Treatment: Rehabilitation  SUBJECTIVE:                                                                                                                                                                                           SUBJECTIVE STATEMENT: Pt reports mild end range tightness from cording. ROM has improved significantly as has pain however, MD wanted her to be seen for [place] to assist with cording  PERTINENT HISTORY:  Pt has a family history of Ovarian Cancer and Breast Cancer. In 2020 she developed a left axillary mass and had a biopsy done which was benign. In late 2023 there was new asymmetry in the area which was causing discomfort.  Her mm was negative. She had an axillary mass excision determined to be benign tissue with 1 LN. She has noted limitations in ROM and MD identified axillary cording.  PAIN:  Are you having pain? No NPRS scale: 0/10 present, tight Pain location: tightness in axilla Pain orientation: Left  PAIN TYPE: tight,  tingling Pain description: intermittent and tingling  Aggravating factors: reaching to extremes Relieving factors: not reacing  PRECAUTIONS: Other: Left UE lymphedema risk  WEIGHT BEARING RESTRICTIONS: No  FALLS:  Has patient fallen in last 6 months? No  LIVING ENVIRONMENT: Lives with: lives with their family Lives in: House/apartment Stairs: Yes; Has following equipment at home: 14 inside with right rail, 7 outside with bilateral rail  None  OCCUPATION: Teaches academically gifted elementary  LEISURE: walk, read, cook  HAND DOMINANCE: right   PRIOR LEVEL OF FUNCTION: Independent  PATIENT GOALS: Be able  range left shoulder without tightness, decrease cording   OBJECTIVE:  COGNITION: Overall cognitive status: Within functional limits for tasks assessed   PALPATION: Several small cords noted in left axillary region not very visible but palpable with MFR techniques  OBSERVATIONS / OTHER ASSESSMENTS: Long incision Left axillary region with mild induration  SENSATION: Light touch: Deficits left axillary region   POSTURE: forward head rounded shoulders  UPPER EXTREMITY AROM/PROM:  A/PROM RIGHT   eval   Shoulder extension 57  Shoulder flexion 172  Shoulder abduction 180  Shoulder internal rotation 55  Shoulder external rotation 110    (Blank rows = not tested)  A/PROM LEFT   eval  Shoulder extension 56  Shoulder flexion 163  Shoulder abduction 158  Shoulder internal rotation 70  Shoulder external rotation 110    (Blank rows = not tested)  CERVICAL AROM: All within functional limits:     UPPER EXTREMITY STRENGTH: WFL  LYMPHEDEMA ASSESSMENTS:   SURGERY TYPE/DATE: 01/21/2019, 10/11/2022 left axillary mass removal. 1 LN in most recent surgery/benign  NUMBER OF LYMPH NODES REMOVED: 0/1  CHEMOTHERAPY: NO  RADIATION:NO  HORMONE TREATMENT: NO  INFECTIONS: NO  LYMPHEDEMA ASSESSMENTS:   LANDMARK RIGHT  eval  10 cm proximal to olecranon process  28.7  Olecranon process 25.4  10 cm proximal to ulnar styloid process 22.8  Just proximal to ulnar styloid process 16.2  Across hand at thumb web space 18.6  At base of 2nd digit 6.2  (Blank rows = not tested)  LANDMARK LEFT  eval  10 cm proximal to olecranon process 29.5  Olecranon process 25.4  10 cm proximal to ulnar styloid process 22.7  Just proximal to ulnar styloid process 16.2  Across hand at thumb web space 18.3  At base of 2nd digit 6.2  (Blank rows = not tested)    L-DEX LYMPHEDEMA SCREENING:  The patient was assessed using the L-Dex machine today to produce a lymphedema index baseline score. The patient will be reassessed on a regular basis (typically every 3 months) to obtain new L-Dex scores. If the score is > 6.5 points away from his/her baseline score indicating onset of subclinical lymphedema, it will be recommended to wear a compression garment for 4 weeks, 12 hours per day and then be reassessed. If the score continues to be > 6.5 points from baseline at reassessment, we will initiate lymphedema treatment. Assessing in this manner has a 95% rate of preventing clinically significant lymphedema.   L-DEX FLOWSHEETS - 12/12/22 1400       L-DEX LYMPHEDEMA SCREENING   Measurement Type Unilateral    L-DEX MEASUREMENT EXTREMITY Upper Extremity    POSITION  Standing    DOMINANT SIDE Right    At Risk Side Left    BASELINE SCORE (UNILATERAL) 2.1             QUICK DASH SURVEY: 4.55   TODAY'S TREATMENT:  DATE: 12/12/2022  PATIENT EDUCATION:  Education details: NTS, scar massage, reviewed post op exercises, SOZO screens, briefly discussed lymphedema Person educated: Patient Education method: Explanation Education comprehension: verbalized understanding and returned demonstration  HOME EXERCISE PROGRAM: 4 post op exercises,  NTS  ASSESSMENT:  CLINICAL IMPRESSION: Patient is a 52 y.o. female who was seen today for physical therapy evaluation and treatment for left axillary tightness and axillary cording s/p a benign mass excision on 10/11/2022 which included 1 negative LN.. Pt has mild limitations in left shoulder ROM for flexion and abduction, and has several small palpable cords that limit end ranges. She will benefit from skilled PT to address deficits and to educate in precautions for lymphedema. SOZO was performed today and will be used as a baseline. Pt is interested in coming for screening every 3 months for 2 years  OBJECTIVE IMPAIRMENTS: decreased knowledge of condition, decreased ROM, decreased safety awareness, increased fascial restrictions, impaired UE functional use, and postural dysfunction.   ACTIVITY LIMITATIONS: lifting and reach over head  PARTICIPATION LIMITATIONS:  able to do all with minimal limitation  PERSONAL FACTORS: 1 comorbidity: axillary mass removal with 1 LN   are also affecting patient's functional outcome.   REHAB POTENTIAL: Excellent  CLINICAL DECISION MAKING: Stable/uncomplicated  EVALUATION COMPLEXITY: Low  GOALS: Goals reviewed with patient? Yes  SHORT TERM GOALS=LONG TERM GOALS: Target date: 01/23/2023  Pt will be independent in a HEP for shoulder ROM/strength Baseline: Goal status: INITIAL  2.  Pts left shoulder ROM will be WNL for shoulder flexion and abduction Baseline:  Goal status: INITIAL  3.  Pt will be educated in precautions for lymphedema Baseline:  Goal status: INITIAL  4.  Pt will not be bothered by left axillary cording Baseline:  Goal status: INITIAL 5. Pt will be instructed in beginning level strengthening program   Goal Status: Initial PLAN:  PT FREQUENCY: 1-2x/week  PT DURATION: 6 weeks  PLANNED INTERVENTIONS: Therapeutic exercises, Patient/Family education, Self Care, Orthotic/Fit training, scar mobilization, Manual therapy, and  Re-evaluation  PLAN FOR NEXT SESSION: schedule 3 month SOZO, pulleys,review NTS,MFR to left axillary region, PROM,review precautions for lymphedema, progress to strength, update HEP   Claris Pong, PT 12/12/2022, 2:58 PM

## 2022-12-12 ENCOUNTER — Other Ambulatory Visit: Payer: Self-pay

## 2022-12-12 ENCOUNTER — Ambulatory Visit: Payer: BC Managed Care – PPO | Attending: General Surgery

## 2022-12-12 DIAGNOSIS — Z803 Family history of malignant neoplasm of breast: Secondary | ICD-10-CM | POA: Diagnosis not present

## 2022-12-12 DIAGNOSIS — R2232 Localized swelling, mass and lump, left upper limb: Secondary | ICD-10-CM | POA: Diagnosis not present

## 2022-12-12 DIAGNOSIS — M25612 Stiffness of left shoulder, not elsewhere classified: Secondary | ICD-10-CM | POA: Diagnosis not present

## 2022-12-12 DIAGNOSIS — Z9889 Other specified postprocedural states: Secondary | ICD-10-CM | POA: Diagnosis not present

## 2022-12-19 ENCOUNTER — Ambulatory Visit: Payer: BC Managed Care – PPO | Admitting: Physical Therapy

## 2022-12-19 ENCOUNTER — Encounter: Payer: Self-pay | Admitting: Physical Therapy

## 2022-12-19 DIAGNOSIS — Z9889 Other specified postprocedural states: Secondary | ICD-10-CM

## 2022-12-19 DIAGNOSIS — M25612 Stiffness of left shoulder, not elsewhere classified: Secondary | ICD-10-CM

## 2022-12-19 DIAGNOSIS — R2232 Localized swelling, mass and lump, left upper limb: Secondary | ICD-10-CM

## 2022-12-19 NOTE — Therapy (Signed)
OUTPATIENT PHYSICAL THERAPY ONCOLOGY TREATMENT  Patient Name: Tammy Powell MRN: [id number] DOB:1971/04/19, 52 y.o., female Today's Date: 12/19/2022  END OF SESSION:  PT End of Session - 12/19/22 1503     Visit Number 2    Number of Visits 12    Date for PT Re-Evaluation 01/23/23    PT Start Time 1502    PT Stop Time 1553    PT Time Calculation (min) 51 min    Activity Tolerance Patient tolerated treatment well    Behavior During Therapy [facility] for tasks assessed/performed             History reviewed. No pertinent past medical history. Past Surgical History:  Procedure Laterality Date   BREAST EXCISIONAL BIOPSY Right    There are no problems to display for this patient.   PCP: Shon Baton, MD  REFERRING PROVIDER: Rolm Bookbinder, MD  REFERRING DIAG: Cording and limitations in function  THERAPY DIAG:  Axillary mass, left  Postoperative state  Stiffness of left shoulder, not elsewhere classified  ONSET DATE: 10/11/2022  Rationale for Evaluation and Treatment: Rehabilitation  SUBJECTIVE:                                                                                                                                                                                           SUBJECTIVE STATEMENT: I have been doing the stretches for the cording and it is slowly getting better.   PERTINENT HISTORY:  Pt has a family history of Ovarian Cancer and Breast Cancer. In 2020 she developed a left axillary mass and had a biopsy done which was benign. In late 2023 there was new asymmetry in the area which was causing discomfort.  Her mm was negative. She had an axillary mass excision determined to be benign tissue with 1 LN. She has noted limitations in ROM and MD identified axillary cording.  PAIN:  Are you having pain? No NPRS scale: 0/10 present, tight Pain location: tightness in axilla Pain orientation: Left  PAIN TYPE: tight, tingling Pain description: intermittent  and tingling  Aggravating factors: reaching to extremes Relieving factors: not reacing  PRECAUTIONS: Other: Left UE lymphedema risk  WEIGHT BEARING RESTRICTIONS: No  FALLS:  Has patient fallen in last 6 months? No  LIVING ENVIRONMENT: Lives with: lives with their family Lives in: House/apartment Stairs: Yes; Has following equipment at home: 14 inside with right rail, 7 outside with bilateral rail  None  OCCUPATION: Teaches academically gifted elementary  LEISURE: walk, read, cook  HAND DOMINANCE: right   PRIOR LEVEL OF FUNCTION: Independent  PATIENT GOALS: Be able range left shoulder without tightness, decrease cording  OBJECTIVE:  COGNITION: Overall cognitive status: Within functional limits for tasks assessed   PALPATION: Several small cords noted in left axillary region not very visible but palpable with MFR techniques  OBSERVATIONS / OTHER ASSESSMENTS: Long incision Left axillary region with mild induration  SENSATION: Light touch: Deficits left axillary region   POSTURE: forward head rounded shoulders  UPPER EXTREMITY AROM/PROM:  A/PROM RIGHT   eval   Shoulder extension 57  Shoulder flexion 172  Shoulder abduction 180  Shoulder internal rotation 55  Shoulder external rotation 110    (Blank rows = not tested)  A/PROM LEFT   eval  Shoulder extension 56  Shoulder flexion 163  Shoulder abduction 158  Shoulder internal rotation 70  Shoulder external rotation 110    (Blank rows = not tested)  CERVICAL AROM: All within functional limits:     UPPER EXTREMITY STRENGTH: WFL  LYMPHEDEMA ASSESSMENTS:   SURGERY TYPE/DATE: 01/21/2019, 10/11/2022 left axillary mass removal. 1 LN in most recent surgery/benign  NUMBER OF LYMPH NODES REMOVED: 0/1  CHEMOTHERAPY: NO  RADIATION:NO  HORMONE TREATMENT: NO  INFECTIONS: NO  LYMPHEDEMA ASSESSMENTS:   LANDMARK RIGHT  eval  10 cm proximal to olecranon process 28.7  Olecranon process 25.4  10 cm  proximal to ulnar styloid process 22.8  Just proximal to ulnar styloid process 16.2  Across hand at thumb web space 18.6  At base of 2nd digit 6.2  (Blank rows = not tested)  LANDMARK LEFT  eval  10 cm proximal to olecranon process 29.5  Olecranon process 25.4  10 cm proximal to ulnar styloid process 22.7  Just proximal to ulnar styloid process 16.2  Across hand at thumb web space 18.3  At base of 2nd digit 6.2  (Blank rows = not tested)    L-DEX LYMPHEDEMA SCREENING:  The patient was assessed using the L-Dex machine today to produce a lymphedema index baseline score. The patient will be reassessed on a regular basis (typically every 3 months) to obtain new L-Dex scores. If the score is > 6.5 points away from his/her baseline score indicating onset of subclinical lymphedema, it will be recommended to wear a compression garment for 4 weeks, 12 hours per day and then be reassessed. If the score continues to be > 6.5 points from baseline at reassessment, we will initiate lymphedema treatment. Assessing in this manner has a 95% rate of preventing clinically significant lymphedema.     QUICK DASH SURVEY: 4.55   TODAY'S TREATMENT:                                                                                                                                         DATE:  12/19/22: Pulleys x 2 min in direction of flexion and abduction with v/c to hold for a few seconds to stretch cord, ball up wall x 10 reps in direction of flexion and abduction on L to  help stretching cording MFR in supine in various degrees of abduction to cording in axilla, upper arm and antecubtial fossa. Therapist able to palpate tightness in area of cording but unable to feel cord while in supine but it was easier to palpate with pt in seated position. Pt felt decreased tightness by end of session. Cord no longer extending to forearm but rather to medial antecubital fossa STM to fibrotic area in L axilla  PATIENT  EDUCATION:  Education details: NTS, scar massage, reviewed post op exercises, SOZO screens, briefly discussed lymphedema Person educated: Patient Education method: Explanation Education comprehension: verbalized understanding and returned demonstration  HOME EXERCISE PROGRAM: 4 post op exercises, NTS  ASSESSMENT:  CLINICAL IMPRESSION: Focused on MFR to cording today with cording most palpable with pt in seated position but tightness still palpable in supine. Pt felt less tightness following today's session. Also performed STM to scar tissue in L axilla where there is increased fibrosis. Encouraged pt to continue stretches especially in directions that feel tight while pinning cording with opposite hand.   OBJECTIVE IMPAIRMENTS: decreased knowledge of condition, decreased ROM, decreased safety awareness, increased fascial restrictions, impaired UE functional use, and postural dysfunction.   ACTIVITY LIMITATIONS: lifting and reach over head  PARTICIPATION LIMITATIONS:  able to do all with minimal limitation  PERSONAL FACTORS: 1 comorbidity: axillary mass removal with 1 LN   are also affecting patient's functional outcome.   REHAB POTENTIAL: Excellent  CLINICAL DECISION MAKING: Stable/uncomplicated  EVALUATION COMPLEXITY: Low  GOALS: Goals reviewed with patient? Yes  SHORT TERM GOALS=LONG TERM GOALS: Target date: 01/23/2023  Pt will be independent in a HEP for shoulder ROM/strength Baseline: Goal status: INITIAL  2.  Pts left shoulder ROM will be WNL for shoulder flexion and abduction Baseline:  Goal status: INITIAL  3.  Pt will be educated in precautions for lymphedema Baseline:  Goal status: INITIAL  4.  Pt will not be bothered by left axillary cording Baseline:  Goal status: INITIAL 5. Pt will be instructed in beginning level strengthening program   Goal Status: Initial PLAN:  PT FREQUENCY: 1-2x/week  PT DURATION: 6 weeks  PLANNED INTERVENTIONS: Therapeutic  exercises, Patient/Family education, Self Care, Orthotic/Fit training, scar mobilization, Manual therapy, and Re-evaluation  PLAN FOR NEXT SESSION: schedule 3 month SOZO, pulleys,review NTS,MFR to left axillary region, PROM,review precautions for lymphedema, progress to strength, update HEP   [employer], PT 12/19/2022, 4:00 PM

## 2022-12-21 ENCOUNTER — Ambulatory Visit: Payer: BC Managed Care – PPO

## 2022-12-21 DIAGNOSIS — Z803 Family history of malignant neoplasm of breast: Secondary | ICD-10-CM

## 2022-12-21 DIAGNOSIS — R2232 Localized swelling, mass and lump, left upper limb: Secondary | ICD-10-CM

## 2022-12-21 DIAGNOSIS — Z9889 Other specified postprocedural states: Secondary | ICD-10-CM

## 2022-12-21 DIAGNOSIS — M25612 Stiffness of left shoulder, not elsewhere classified: Secondary | ICD-10-CM

## 2022-12-21 NOTE — Therapy (Signed)
OUTPATIENT PHYSICAL THERAPY ONCOLOGY TREATMENT  Patient Name: Tammy Powell MRN: [id number] DOB:02-May-1971, 52 y.o., female Today's Date: 12/21/2022  END OF SESSION:  PT End of Session - 12/21/22 1506     Visit Number 3    Number of Visits 12    Date for PT Re-Evaluation 01/23/23    PT Start Time 1506    PT Stop Time 1551    PT Time Calculation (min) 45 min    Activity Tolerance Patient tolerated treatment well    Behavior During Therapy [facility] for tasks assessed/performed             History reviewed. No pertinent past medical history. Past Surgical History:  Procedure Laterality Date   BREAST EXCISIONAL BIOPSY Right    There are no problems to display for this patient.   PCP: Shon Baton, MD  REFERRING PROVIDER: Rolm Bookbinder, MD  REFERRING DIAG: Cording and limitations in function  THERAPY DIAG:  Axillary mass, left  Postoperative state  Stiffness of left shoulder, not elsewhere classified  Family history of breast cancer  ONSET DATE: 10/11/2022  Rationale for Evaluation and Treatment: Rehabilitation  SUBJECTIVE:                                                                                                                                                                                           SUBJECTIVE STATEMENT: I think I am doing well. I can still feel and see the cords but they are going away. I am keeping up with my HEP  PERTINENT HISTORY:  Pt has a family history of Ovarian Cancer and Breast Cancer. In 2020 she developed a left axillary mass and had a biopsy done which was benign. In late 2023 there was new asymmetry in the area which was causing discomfort.  Her mm was negative. She had an axillary mass excision determined to be benign tissue with 1 LN. She has noted limitations in ROM and MD identified axillary cording.  PAIN:  Are you having pain? No NPRS scale: 0/10 present, tight Pain location: tightness in axilla Pain orientation:  Left  PAIN TYPE: tight, tingling Pain description: intermittent and tingling  Aggravating factors: reaching to extremes Relieving factors: not reacing  PRECAUTIONS: Other: Left UE lymphedema risk  WEIGHT BEARING RESTRICTIONS: No  FALLS:  Has patient fallen in last 6 months? No  LIVING ENVIRONMENT: Lives with: lives with their family Lives in: House/apartment Stairs: Yes; Has following equipment at home: 14 inside with right rail, 7 outside with bilateral rail  None  OCCUPATION: Teaches academically gifted elementary  LEISURE: walk, read, cook  HAND DOMINANCE: right   PRIOR  LEVEL OF FUNCTION: Independent  PATIENT GOALS: Be able range left shoulder without tightness, decrease cording   OBJECTIVE:  COGNITION: Overall cognitive status: Within functional limits for tasks assessed   PALPATION: Several small cords noted in left axillary region not very visible but palpable with MFR techniques  OBSERVATIONS / OTHER ASSESSMENTS: Long incision Left axillary region with mild induration  SENSATION: Light touch: Deficits left axillary region   POSTURE: forward head rounded shoulders  UPPER EXTREMITY AROM/PROM:  A/PROM RIGHT   eval   Shoulder extension 57  Shoulder flexion 172  Shoulder abduction 180  Shoulder internal rotation 55  Shoulder external rotation 110    (Blank rows = not tested)  A/PROM LEFT   eval LEFT 12/21/2022  Shoulder extension 56   Shoulder flexion 163 169  Shoulder abduction 158 170  Shoulder internal rotation 70   Shoulder external rotation 110     (Blank rows = not tested)  CERVICAL AROM: All within functional limits:     UPPER EXTREMITY STRENGTH: WFL  LYMPHEDEMA ASSESSMENTS:   SURGERY TYPE/DATE: 01/21/2019, 10/11/2022 left axillary mass removal. 1 LN in most recent surgery/benign  NUMBER OF LYMPH NODES REMOVED: 0/1  CHEMOTHERAPY: NO  RADIATION:NO  HORMONE TREATMENT: NO  INFECTIONS: NO  LYMPHEDEMA ASSESSMENTS:   LANDMARK  RIGHT  eval  10 cm proximal to olecranon process 28.7  Olecranon process 25.4  10 cm proximal to ulnar styloid process 22.8  Just proximal to ulnar styloid process 16.2  Across hand at thumb web space 18.6  At base of 2nd digit 6.2  (Blank rows = not tested)  LANDMARK LEFT  eval  10 cm proximal to olecranon process 29.5  Olecranon process 25.4  10 cm proximal to ulnar styloid process 22.7  Just proximal to ulnar styloid process 16.2  Across hand at thumb web space 18.3  At base of 2nd digit 6.2  (Blank rows = not tested)    L-DEX LYMPHEDEMA SCREENING:  The patient was assessed using the L-Dex machine today to produce a lymphedema index baseline score. The patient will be reassessed on a regular basis (typically every 3 months) to obtain new L-Dex scores. If the score is > 6.5 points away from his/her baseline score indicating onset of subclinical lymphedema, it will be recommended to wear a compression garment for 4 weeks, 12 hours per day and then be reassessed. If the score continues to be > 6.5 points from baseline at reassessment, we will initiate lymphedema treatment. Assessing in this manner has a 95% rate of preventing clinically significant lymphedema.     QUICK DASH SURVEY: 4.55   TODAY'S TREATMENT:                                                                                                                                         DATE:   12/21/2022  Pulleys for flexion and abduction x 2 min ea,  ball rolls for flexion x 10, abduction x7 MFR in supine with arm in varying angles to work on cording at axilla and upper arm STM to fibrotic area in L axilla PROM to left shoulder flexion, scaption, abd with pinning at axilla.   12/19/22: Pulleys x 2 min in direction of flexion and abduction with v/c to hold for a few seconds to stretch cord, ball up wall x 10 reps in direction of flexion and abduction on L to help stretching cording MFR in supine in various degrees of  abduction to cording in axilla, upper arm and antecubtial fossa. Therapist able to palpate tightness in area of cording but unable to feel cord while in supine but it was easier to palpate with pt in seated position. Pt felt decreased tightness by end of session. Cord no longer extending to forearm but rather to medial antecubital fossa STM to fibrotic area in L axilla  PATIENT EDUCATION:  Education details: NTS, scar massage, reviewed post op exercises, SOZO screens, briefly discussed lymphedema Person educated: Patient Education method: Explanation Education comprehension: verbalized understanding and returned demonstration  HOME EXERCISE PROGRAM: 4 post op exercises, NTS  ASSESSMENT:  CLINICAL IMPRESSION: Pt is making good progress with left shoulder ROM and has increased flexion by 6 degrees and abduction by 12 degrees.  Cording still present but not interfering with ROM as much.   OBJECTIVE IMPAIRMENTS: decreased knowledge of condition, decreased ROM, decreased safety awareness, increased fascial restrictions, impaired UE functional use, and postural dysfunction.   ACTIVITY LIMITATIONS: lifting and reach over head  PARTICIPATION LIMITATIONS:  able to do all with minimal limitation  PERSONAL FACTORS: 1 comorbidity: axillary mass removal with 1 LN   are also affecting patient's functional outcome.   REHAB POTENTIAL: Excellent  CLINICAL DECISION MAKING: Stable/uncomplicated  EVALUATION COMPLEXITY: Low  GOALS: Goals reviewed with patient? Yes  SHORT TERM GOALS=LONG TERM GOALS: Target date: 01/23/2023  Pt will be independent in a HEP for shoulder ROM/strength Baseline: Goal status: MET 12/21/2022 2.  Pts left shoulder ROM will be WNL for shoulder flexion and abduction Baseline:  Goal status:Progressing  3.  Pt will be educated in precautions for lymphedema Baseline:  Goal status: INITIAL  4.  Pt will not be bothered by left axillary cording Baseline:  Goal status:  INITIAL 5. Pt will be instructed in beginning level strengthening program   Goal Status: Initial PLAN:  PT FREQUENCY: 1-2x/week  PT DURATION: 6 weeks  PLANNED INTERVENTIONS: Therapeutic exercises, Patient/Family education, Self Care, Orthotic/Fit training, scar mobilization, Manual therapy, and Re-evaluation  PLAN FOR NEXT SESSION: 3 D AROM, supine scap series,, pulleys,review NTS,MFR to left axillary region, PROM,review precautions for lymphedema, progress to strength, update HEP   Claris Pong, PT 12/21/2022, 3:56 PM

## 2022-12-26 ENCOUNTER — Ambulatory Visit: Payer: BC Managed Care – PPO | Attending: General Surgery

## 2022-12-26 DIAGNOSIS — Z9889 Other specified postprocedural states: Secondary | ICD-10-CM | POA: Diagnosis present

## 2022-12-26 DIAGNOSIS — M25612 Stiffness of left shoulder, not elsewhere classified: Secondary | ICD-10-CM | POA: Diagnosis present

## 2022-12-26 DIAGNOSIS — R2232 Localized swelling, mass and lump, left upper limb: Secondary | ICD-10-CM | POA: Insufficient documentation

## 2022-12-26 DIAGNOSIS — Z803 Family history of malignant neoplasm of breast: Secondary | ICD-10-CM | POA: Diagnosis present

## 2022-12-26 NOTE — Therapy (Signed)
OUTPATIENT PHYSICAL THERAPY ONCOLOGY TREATMENT  Patient Name: Tammy Powell MRN: [id number] DOB:25-Apr-1971, 52 y.o., female Today's Date: 12/26/2022  END OF SESSION:  PT End of Session - 12/26/22 1501     Visit Number 4    Number of Visits 12    Date for PT Re-Evaluation 01/23/23    PT Start Time 1502    PT Stop Time 1552    PT Time Calculation (min) 50 min    Activity Tolerance Patient tolerated treatment well    Behavior During Therapy [facility] for tasks assessed/performed             History reviewed. No pertinent past medical history. Past Surgical History:  Procedure Laterality Date   BREAST EXCISIONAL BIOPSY Right    There are no problems to display for this patient.   PCP: Shon Baton, MD  REFERRING PROVIDER: Rolm Bookbinder, MD  REFERRING DIAG: Cording and limitations in function  THERAPY DIAG:  Axillary mass, left  Postoperative state  Stiffness of left shoulder, not elsewhere classified  Family history of breast cancer  ONSET DATE: 10/11/2022  Rationale for Evaluation and Treatment: Rehabilitation  SUBJECTIVE:                                                                                                                                                                                           SUBJECTIVE STATEMENT: I am feeling better. Less and less restricted. Cording not as predominant.  PERTINENT HISTORY:  Pt has a family history of Ovarian Cancer and Breast Cancer. In 2020 she developed a left axillary mass and had a biopsy done which was benign. In late 2023 there was new asymmetry in the area which was causing discomfort.  Her mm was negative. She had an axillary mass excision determined to be benign tissue with 1 LN. She has noted limitations in ROM and MD identified axillary cording.  PAIN:  Are you having pain? No NPRS scale: 0/10 present, tight Pain location: tightness in axilla Pain orientation: Left  PAIN TYPE: tight, tingling Pain  description: intermittent and tingling  Aggravating factors: reaching to extremes Relieving factors: not reacing  PRECAUTIONS: Other: Left UE lymphedema risk  WEIGHT BEARING RESTRICTIONS: No  FALLS:  Has patient fallen in last 6 months? No  LIVING ENVIRONMENT: Lives with: lives with their family Lives in: House/apartment Stairs: Yes; Has following equipment at home: 14 inside with right rail, 7 outside with bilateral rail  None  OCCUPATION: Teaches academically gifted elementary  LEISURE: walk, read, cook  HAND DOMINANCE: right   PRIOR LEVEL OF FUNCTION: Independent  PATIENT GOALS: Be able range left shoulder without tightness,  decrease cording   OBJECTIVE:  COGNITION: Overall cognitive status: Within functional limits for tasks assessed   PALPATION: Several small cords noted in left axillary region not very visible but palpable with MFR techniques  OBSERVATIONS / OTHER ASSESSMENTS: Long incision Left axillary region with mild induration  SENSATION: Light touch: Deficits left axillary region   POSTURE: forward head rounded shoulders  UPPER EXTREMITY AROM/PROM:  A/PROM RIGHT   eval   Shoulder extension 57  Shoulder flexion 172  Shoulder abduction 180  Shoulder internal rotation 55  Shoulder external rotation 110    (Blank rows = not tested)  A/PROM LEFT   eval LEFT 12/21/2022  Shoulder extension 56   Shoulder flexion 163 169  Shoulder abduction 158 170  Shoulder internal rotation 70   Shoulder external rotation 110     (Blank rows = not tested)  CERVICAL AROM: All within functional limits:     UPPER EXTREMITY STRENGTH: WFL  LYMPHEDEMA ASSESSMENTS:   SURGERY TYPE/DATE: 01/21/2019, 10/11/2022 left axillary mass removal. 1 LN in most recent surgery/benign  NUMBER OF LYMPH NODES REMOVED: 0/1  CHEMOTHERAPY: NO  RADIATION:NO  HORMONE TREATMENT: NO  INFECTIONS: NO  LYMPHEDEMA ASSESSMENTS:   LANDMARK RIGHT  eval  10 cm proximal to  olecranon process 28.7  Olecranon process 25.4  10 cm proximal to ulnar styloid process 22.8  Just proximal to ulnar styloid process 16.2  Across hand at thumb web space 18.6  At base of 2nd digit 6.2  (Blank rows = not tested)  LANDMARK LEFT  eval  10 cm proximal to olecranon process 29.5  Olecranon process 25.4  10 cm proximal to ulnar styloid process 22.7  Just proximal to ulnar styloid process 16.2  Across hand at thumb web space 18.3  At base of 2nd digit 6.2  (Blank rows = not tested)    L-DEX LYMPHEDEMA SCREENING:  The patient was assessed using the L-Dex machine today to produce a lymphedema index baseline score. The patient will be reassessed on a regular basis (typically every 3 months) to obtain new L-Dex scores. If the score is > 6.5 points away from his/her baseline score indicating onset of subclinical lymphedema, it will be recommended to wear a compression garment for 4 weeks, 12 hours per day and then be reassessed. If the score continues to be > 6.5 points from baseline at reassessment, we will initiate lymphedema treatment. Assessing in this manner has a 95% rate of preventing clinically significant lymphedema.     QUICK DASH SURVEY: 4.55   TODAY'S TREATMENT:                                                                                                                                         DATE:   12/26/2022 Diona Foley rollson wall  flexion x 10 and abduction x 10 MFR in supine with arm in varying angles to work on cording at  axilla and upper arm STM to fibrotic area in L axilla PROM to left shoulder flexion, scaption, abd with pinning at axilla. Supine scapular series yellow x 10 ea, scapular retraction x 10 standing with yellow. Updated HEP with pics of above band exercises. (Gave packet for template but only did SR) 12/21/2022  Pulleys for flexion and abduction x 2 min ea, ball rolls for flexion x 10, abduction x7 MFR in supine with arm in varying angles to  work on cording at axilla and upper arm STM to fibrotic area in L axilla PROM to left shoulder flexion, scaption, abd with pinning at axilla.   12/19/22: Pulleys x 2 min in direction of flexion and abduction with v/c to hold for a few seconds to stretch cord, ball up wall x 10 reps in direction of flexion and abduction on L to help stretching cording MFR in supine in various degrees of abduction to cording in axilla, upper arm and antecubtial fossa. Therapist able to palpate tightness in area of cording but unable to feel cord while in supine but it was easier to palpate with pt in seated position. Pt felt decreased tightness by end of session. Cord no longer extending to forearm but rather to medial antecubital fossa STM to fibrotic area in L axilla  PATIENT EDUCATION:  Education details: NTS, scar massage, reviewed post op exercises, SOZO screens, briefly discussed lymphedema STM to fibrotic area in L axilla PROM to left shoulder flexion, scaption, abd with pinning at axilla. Person educated: Patient Education method: Explanation Education comprehension: verbalized understanding and returned demonstration  HOME EXERCISE PROGRAM: 4 post op exercises, NTS, supine scapular series x 10 with yellow, standing scapular retraction  ASSESSMENT:  CLINICAL IMPRESSION: Pts left arm was fatigued after exercises. Cording still present but not really limiting and pts arm is feeling much better overall. HEP was updated today   OBJECTIVE IMPAIRMENTS: decreased knowledge of condition, decreased ROM, decreased safety awareness, increased fascial restrictions, impaired UE functional use, and postural dysfunction.   ACTIVITY LIMITATIONS: lifting and reach over head  PARTICIPATION LIMITATIONS:  able to do all with minimal limitation  PERSONAL FACTORS: 1 comorbidity: axillary mass removal with 1 LN   are also affecting patient's functional outcome.   REHAB POTENTIAL: Excellent  CLINICAL DECISION  MAKING: Stable/uncomplicated  EVALUATION COMPLEXITY: Low  GOALS: Goals reviewed with patient? Yes  SHORT TERM GOALS=LONG TERM GOALS: Target date: 01/23/2023  Pt will be independent in a HEP for shoulder ROM/strength Baseline: Goal status: MET 12/21/2022 2.  Pts left shoulder ROM will be WNL for shoulder flexion and abduction Baseline:  Goal status:Progressing  3.  Pt will be educated in precautions for lymphedema Baseline:  Goal status: INITIAL  4.  Pt will not be bothered by left axillary cording Baseline:  Goal status: INITIAL 5. Pt will be instructed in beginning level strengthening program   Goal Status: Initial PLAN:  PT FREQUENCY: 1-2x/week  PT DURATION: 6 weeks  PLANNED INTERVENTIONS: Therapeutic exercises, Patient/Family education, Self Care, Orthotic/Fit training, scar mobilization, Manual therapy, and Re-evaluation  PLAN FOR NEXT SESSION: 3 D AROM,  review supine scap series, add standing TB and jobes,, pulleys,review NTS,MFR to left axillary region, PROM,review precautions for lymphedema, progress to strength, update HEP   Claris Pong, PT 12/26/2022, 3:58 PM

## 2022-12-26 NOTE — Patient Instructions (Signed)

## 2022-12-28 ENCOUNTER — Ambulatory Visit: Payer: BC Managed Care – PPO

## 2022-12-28 DIAGNOSIS — Z803 Family history of malignant neoplasm of breast: Secondary | ICD-10-CM

## 2022-12-28 DIAGNOSIS — Z9889 Other specified postprocedural states: Secondary | ICD-10-CM

## 2022-12-28 DIAGNOSIS — R2232 Localized swelling, mass and lump, left upper limb: Secondary | ICD-10-CM | POA: Diagnosis not present

## 2022-12-28 DIAGNOSIS — M25612 Stiffness of left shoulder, not elsewhere classified: Secondary | ICD-10-CM

## 2022-12-28 NOTE — Therapy (Signed)
OUTPATIENT PHYSICAL THERAPY ONCOLOGY TREATMENT  Patient Name: Tammy Powell MRN: [id number] DOB:Apr 03, 1971, 52 y.o., female Today's Date: 12/28/2022  END OF SESSION:  PT End of Session - 12/28/22 1459     Visit Number 5    Number of Visits 12    Date for PT Re-Evaluation 01/23/23    PT Start Time 1500    PT Stop Time 1555    PT Time Calculation (min) 55 min    Activity Tolerance Patient tolerated treatment well    Behavior During Therapy [facility] for tasks assessed/performed             History reviewed. No pertinent past medical history. Past Surgical History:  Procedure Laterality Date   BREAST EXCISIONAL BIOPSY Right    There are no problems to display for this patient.   PCP: Shon Baton, MD  REFERRING PROVIDER: Rolm Bookbinder, MD  REFERRING DIAG: Cording and limitations in function  THERAPY DIAG:  Axillary mass, left  Postoperative state  Stiffness of left shoulder, not elsewhere classified  Family history of breast cancer  ONSET DATE: 10/11/2022  Rationale for Evaluation and Treatment: Rehabilitation  SUBJECTIVE:                                                                                                                                                                                           SUBJECTIVE STATEMENT: Did well after last treatment. No pain today. Cording seems to be better and less visible. I can feel the improvement when I am reaching to put up dishes.  PERTINENT HISTORY:  Pt has a family history of Ovarian Cancer and Breast Cancer. In 2020 she developed a left axillary mass and had a biopsy done which was benign. In late 2023 there was new asymmetry in the area which was causing discomfort.  Her mm was negative. She had an axillary mass excision determined to be benign tissue with 1 LN. She has noted limitations in ROM and MD identified axillary cording.  PAIN:  Are you having pain? No NPRS scale: 0/10 present, tight Pain location:  tightness in axilla Pain orientation: Left  PAIN TYPE: tight, tingling Pain description: intermittent and tingling  Aggravating factors: reaching to extremes Relieving factors: not reacing  PRECAUTIONS: Other: Left UE lymphedema risk  WEIGHT BEARING RESTRICTIONS: No  FALLS:  Has patient fallen in last 6 months? No  LIVING ENVIRONMENT: Lives with: lives with their family Lives in: House/apartment Stairs: Yes; Has following equipment at home: 14 inside with right rail, 7 outside with bilateral rail  None  OCCUPATION: Teaches academically gifted elementary  LEISURE: walk, read, cook  HAND DOMINANCE: right  PRIOR LEVEL OF FUNCTION: Independent  PATIENT GOALS: Be able to  range left shoulder without tightness, decrease cording   OBJECTIVE:  COGNITION: Overall cognitive status: Within functional limits for tasks assessed   PALPATION: Several small cords noted in left axillary region not very visible but palpable with MFR techniques  OBSERVATIONS / OTHER ASSESSMENTS: Long incision Left axillary region with mild induration  SENSATION: Light touch: Deficits left axillary region   POSTURE: forward head rounded shoulders  UPPER EXTREMITY AROM/PROM:  A/PROM RIGHT   eval   Shoulder extension 57  Shoulder flexion 172  Shoulder abduction 180  Shoulder internal rotation 55  Shoulder external rotation 110    (Blank rows = not tested)  A/PROM LEFT   eval LEFT 12/21/2022 LEFT 12/28/2022  Shoulder extension 56    Shoulder flexion 163 169 168  Shoulder abduction 158 170 175  Shoulder internal rotation 70    Shoulder external rotation 110      (Blank rows = not tested)  CERVICAL AROM: All within functional limits:     UPPER EXTREMITY STRENGTH: WFL  LYMPHEDEMA ASSESSMENTS:   SURGERY TYPE/DATE: 01/21/2019, 10/11/2022 left axillary mass removal. 1 LN in most recent surgery/benign  NUMBER OF LYMPH NODES REMOVED: 0/1  CHEMOTHERAPY: NO  RADIATION:NO  HORMONE  TREATMENT: NO  INFECTIONS: NO  LYMPHEDEMA ASSESSMENTS:   LANDMARK RIGHT  eval  10 cm proximal to olecranon process 28.7  Olecranon process 25.4  10 cm proximal to ulnar styloid process 22.8  Just proximal to ulnar styloid process 16.2  Across hand at thumb web space 18.6  At base of 2nd digit 6.2  (Blank rows = not tested)  LANDMARK LEFT  eval  10 cm proximal to olecranon process 29.5  Olecranon process 25.4  10 cm proximal to ulnar styloid process 22.7  Just proximal to ulnar styloid process 16.2  Across hand at thumb web space 18.3  At base of 2nd digit 6.2  (Blank rows = not tested)    L-DEX LYMPHEDEMA SCREENING:  The patient was assessed using the L-Dex machine today to produce a lymphedema index baseline score. The patient will be reassessed on a regular basis (typically every 3 months) to obtain new L-Dex scores. If the score is > 6.5 points away from his/her baseline score indicating onset of subclinical lymphedema, it will be recommended to wear a compression garment for 4 weeks, 12 hours per day and then be reassessed. If the score continues to be > 6.5 points from baseline at reassessment, we will initiate lymphedema treatment. Assessing in this manner has a 95% rate of preventing clinically significant lymphedema.     QUICK DASH SURVEY: 4.55   TODAY'S TREATMENT:                                                                                                                                         DATE:   12/28/2022  Pulleys x 1:30 sec for flexion and abduction Ball rolls x 5 flexion and abduction Supine scapular series x 10 ea yellow MFR in supine with arm in varying angles to work on cording at axilla and upper arm STM to fibrotic area in L axilla PROM to left shoulder flexion, scaption, abd with pinning at axilla. Supine scapular series yellow x 10 ea, standing scapular retraction and shoulder extension with yellow band x 10 Gave lymphedema ABC handout to pt   12/26/2022 Ball rollson wall  flexion x 10 and abduction x 10 MFR in supine with arm in varying angles to work on cording at axilla and upper arm STM to fibrotic area in L axilla PROM to left shoulder flexion, scaption, abd with pinning at axilla. Supine scapular series yellow x 10 ea, scapular retraction x 10 standing with yellow. Updated HEP with pics of above band exercises. (Gave packet for template but only did SR) 12/21/2022  Pulleys for flexion and abduction x 2 min ea, ball rolls for flexion x 10, abduction x7 MFR in supine with arm in varying angles to work on cording at axilla and upper arm STM to fibrotic area in L axilla PROM to left shoulder flexion, scaption, abd with pinning at axilla.   12/19/22: Pulleys x 2 min in direction of flexion and abduction with v/c to hold for a few seconds to stretch cord, ball up wall x 10 reps in direction of flexion and abduction on L to help stretching cording MFR in supine in various degrees of abduction to cording in axilla, upper arm and antecubtial fossa. Therapist able to palpate tightness in area of cording but unable to feel cord while in supine but it was easier to palpate with pt in seated position. Pt felt decreased tightness by end of session. Cord no longer extending to forearm but rather to medial antecubital fossa STM to fibrotic area in L axilla  PATIENT EDUCATION:  Education details: NTS, scar massage, reviewed post op exercises, SOZO screens, briefly discussed lymphedema STM to fibrotic area in L axilla PROM to left shoulder flexion, scaption, abd with pinning at axilla. Person educated: Patient Education method: Explanation Education comprehension: verbalized understanding and returned demonstration  HOME EXERCISE PROGRAM: 4 post op exercises, NTS, supine scapular series x 10 with yellow, standing scapular retraction, shoulder extension  ASSESSMENT:  CLINICAL IMPRESSION: Pt with good improvement in abduction ROM. Cording  still present and causing tightness at end ranges, but not that limiting or painful. Pt fatigues with band exercises, but requires only occasional VC's for correct form.   OBJECTIVE IMPAIRMENTS: decreased knowledge of condition, decreased ROM, decreased safety awareness, increased fascial restrictions, impaired UE functional use, and postural dysfunction.   ACTIVITY LIMITATIONS: lifting and reach over head  PARTICIPATION LIMITATIONS:  able to do all with minimal limitation  PERSONAL FACTORS: 1 comorbidity: axillary mass removal with 1 LN   are also affecting patient's functional outcome.   REHAB POTENTIAL: Excellent  CLINICAL DECISION MAKING: Stable/uncomplicated  EVALUATION COMPLEXITY: Low  GOALS: Goals reviewed with patient? Yes  SHORT TERM GOALS=LONG TERM GOALS: Target date: 01/23/2023  Pt will be independent in a HEP for shoulder ROM/strength Baseline: Goal status: MET 12/21/2022 2.  Pts left shoulder ROM will be WNL for shoulder flexion and abduction Baseline:  Goal status:Progressing  3.  Pt will be educated in precautions for lymphedema Baseline:  Goal status: INITIAL  4.  Pt will not be bothered by left axillary cording Baseline:  Goal status: INITIAL 5. Pt  will be instructed in beginning level strengthening program   Goal Status: Initial PLAN:  PT FREQUENCY: 1-2x/week  PT DURATION: 6 weeks  PLANNED INTERVENTIONS: Therapeutic exercises, Patient/Family education, Self Care, Orthotic/Fit training, scar mobilization, Manual therapy, and Re-evaluation  PLAN FOR NEXT SESSION: 3 D AROM,  review supine scap series, add standing ER and jobes,, pulleys,review NTS,MFR to left axillary region, PROM,review precautions for lymphedema, progress to strength, update HEP   Claris Pong, PT 12/28/2022, 4:04 PM

## 2023-01-02 ENCOUNTER — Ambulatory Visit: Payer: BC Managed Care – PPO

## 2023-01-02 DIAGNOSIS — Z803 Family history of malignant neoplasm of breast: Secondary | ICD-10-CM

## 2023-01-02 DIAGNOSIS — R2232 Localized swelling, mass and lump, left upper limb: Secondary | ICD-10-CM | POA: Diagnosis not present

## 2023-01-02 DIAGNOSIS — M25612 Stiffness of left shoulder, not elsewhere classified: Secondary | ICD-10-CM

## 2023-01-02 DIAGNOSIS — Z9889 Other specified postprocedural states: Secondary | ICD-10-CM

## 2023-01-02 NOTE — Therapy (Addendum)
OUTPATIENT PHYSICAL THERAPY ONCOLOGY TREATMENT  Patient Name: Tammy Powell MRN: [id number] DOB:[birth year], 52 y.o., female Today's Date: 01/02/2023  END OF SESSION:  PT End of Session - 01/02/23 1459     Visit Number 6    Number of Visits 12    Date for PT Re-Evaluation 01/23/23    PT Start Time 1500    PT Stop Time 1546    PT Time Calculation (min) 46 min    Activity Tolerance Patient tolerated treatment well    Behavior During Therapy [facility] for tasks assessed/performed             History reviewed. No pertinent past medical history. Past Surgical History:  Procedure Laterality Date   BREAST EXCISIONAL BIOPSY Right    There are no problems to display for this patient.   PCP: Shon Baton, MD  REFERRING PROVIDER: Rolm Bookbinder, MD  REFERRING DIAG: Cording and limitations in function  THERAPY DIAG:  Axillary mass, left  Postoperative state  Stiffness of left shoulder, not elsewhere classified  Family history of breast cancer  ONSET DATE: 10/11/2022  Rationale for Evaluation and Treatment: Rehabilitation  SUBJECTIVE:                                                                                                                                                                                           SUBJECTIVE STATEMENT: Did well after last treatment. Cording is still there but not really interfering with anything. A little tightness when reaching to extremes.  PERTINENT HISTORY:  Pt has a family history of Ovarian Cancer and Breast Cancer. In 2020 she developed a left axillary mass and had a biopsy done which was benign. In late 2023 there was new asymmetry in the area which was causing discomfort.  Her mm was negative. She had an axillary mass excision determined to be benign tissue with 1 LN. She has noted limitations in ROM and MD identified axillary cording.  PAIN:  Are you having pain? No NPRS scale: 0/10 present, tight Pain location: tightness  in axilla Pain orientation: Left  PAIN TYPE: tight, tingling Pain description: intermittent and tingling  Aggravating factors: reaching to extremes Relieving factors: not reacing  PRECAUTIONS: Other: Left UE lymphedema risk  WEIGHT BEARING RESTRICTIONS: No  FALLS:  Has patient fallen in last 6 months? No  LIVING ENVIRONMENT: Lives with: lives with their family Lives in: House/apartment Stairs: Yes; Has following equipment at home: 14 inside with right rail, 7 outside with bilateral rail  None  OCCUPATION: Teaches academically gifted elementary  LEISURE: walk, read, cook  HAND DOMINANCE: right   PRIOR LEVEL OF FUNCTION: Independent  PATIENT GOALS: Be able to  range left shoulder without tightness, decrease cording   OBJECTIVE:  COGNITION: Overall cognitive status: Within functional limits for tasks assessed   PALPATION: Several small cords noted in left axillary region not very visible but palpable with MFR techniques  OBSERVATIONS / OTHER ASSESSMENTS: Long incision Left axillary region with mild induration  SENSATION: Light touch: Deficits left axillary region   POSTURE: forward head rounded shoulders  UPPER EXTREMITY AROM/PROM:  A/PROM RIGHT   eval   Shoulder extension 57  Shoulder flexion 172  Shoulder abduction 180  Shoulder internal rotation 55  Shoulder external rotation 110    (Blank rows = not tested)  A/PROM LEFT   eval LEFT 12/21/2022 LEFT 12/28/2022  Shoulder extension 56    Shoulder flexion 163 169 168  Shoulder abduction 158 170 175  Shoulder internal rotation 70    Shoulder external rotation 110      (Blank rows = not tested)  CERVICAL AROM: All within functional limits:     UPPER EXTREMITY STRENGTH: WFL  LYMPHEDEMA ASSESSMENTS:   SURGERY TYPE/DATE: 01/21/2019, 10/11/2022 left axillary mass removal. 1 LN in most recent surgery/benign  NUMBER OF LYMPH NODES REMOVED: 0/1  CHEMOTHERAPY: NO  RADIATION:NO  HORMONE TREATMENT:  NO  INFECTIONS: NO  LYMPHEDEMA ASSESSMENTS:   LANDMARK RIGHT  eval  10 cm proximal to olecranon process 28.7  Olecranon process 25.4  10 cm proximal to ulnar styloid process 22.8  Just proximal to ulnar styloid process 16.2  Across hand at thumb web space 18.6  At base of 2nd digit 6.2  (Blank rows = not tested)  LANDMARK LEFT  eval  10 cm proximal to olecranon process 29.5  Olecranon process 25.4  10 cm proximal to ulnar styloid process 22.7  Just proximal to ulnar styloid process 16.2  Across hand at thumb web space 18.3  At base of 2nd digit 6.2  (Blank rows = not tested)    L-DEX LYMPHEDEMA SCREENING:  The patient was assessed using the L-Dex machine today to produce a lymphedema index baseline score. The patient will be reassessed on a regular basis (typically every 3 months) to obtain new L-Dex scores. If the score is > 6.5 points away from his/her baseline score indicating onset of subclinical lymphedema, it will be recommended to wear a compression garment for 4 weeks, 12 hours per day and then be reassessed. If the score continues to be > 6.5 points from baseline at reassessment, we will initiate lymphedema treatment. Assessing in this manner has a 95% rate of preventing clinically significant lymphedema.     QUICK DASH SURVEY: 4.55   TODAY'S TREATMENT:                                                                                                                                         DATE:    01/02/2023 Standing Scapular retraction and shoulder  extension with yellow x 12, bilateral ER yellow x 12, Standing Jobes flexion and scaption x 10 no wt. MFR in supine with arm in varying angles to work on cording at axilla and upper arm PROM to left shoulder flexion, scaption, abd with pinning at axilla. Cut TG soft (small) for left UE for pt to try to see if it helps with her cording Discussed compression sleeve especially for Flying   12/28/2022 Pulleys x 1:30 sec  for flexion and abduction Ball rolls x 5 flexion and abduction Supine scapular series x 10 ea yellow MFR in supine with arm in varying angles to work on cording at axilla and upper arm STM to fibrotic area in L axilla PROM to left shoulder flexion, scaption, abd with pinning at axilla. Supine scapular series yellow x 10 ea, standing scapular retraction and shoulder extension with yellow band x 10 Gave lymphedema ABC handout to pt  12/26/2022 Ball rollson wall  flexion x 10 and abduction x 10 MFR in supine with arm in varying angles to work on cording at axilla and upper arm STM to fibrotic area in L axilla PROM to left shoulder flexion, scaption, abd with pinning at axilla. Supine scapular series yellow x 10 ea, scapular retraction x 10 standing with yellow. Updated HEP with pics of above band exercises. (Gave packet for template but only did SR) 12/21/2022  Pulleys for flexion and abduction x 2 min ea, ball rolls for flexion x 10, abduction x7 MFR in supine with arm in varying angles to work on cording at axilla and upper arm STM to fibrotic area in L axilla PROM to left shoulder flexion, scaption, abd with pinning at axilla.   12/19/22: Pulleys x 2 min in direction of flexion and abduction with v/c to hold for a few seconds to stretch cord, ball up wall x 10 reps in direction of flexion and abduction on L to help stretching cording MFR in supine in various degrees of abduction to cording in axilla, upper arm and antecubtial fossa. Therapist able to palpate tightness in area of cording but unable to feel cord while in supine but it was easier to palpate with pt in seated position. Pt felt decreased tightness by end of session. Cord no longer extending to forearm but rather to medial antecubital fossa STM to fibrotic area in L axilla  PATIENT EDUCATION:  Education details: NTS, scar massage, reviewed post op exercises, SOZO screens, briefly discussed lymphedema STM to fibrotic area in L  axilla PROM to left shoulder flexion, scaption, abd with pinning at axilla. Person educated: Patient Education method: Explanation Education comprehension: verbalized understanding and returned demonstration  HOME EXERCISE PROGRAM: 4 post op exercises, NTS, supine scapular series x 10 with yellow, standing scapular retraction, shoulder extension, ER with yellow, jobes flexion and scaption  ASSESSMENT:  CLINICAL IMPRESSION: Cording still present and causing mild  tightness at end ranges, but not that limiting or painful. Pt used good form for band exercises and jobes exercises, with mild left UE fatigue. Pt has 1 visit left next week; reassess Cording, goals, and determne if pt can be put on hold.   OBJECTIVE IMPAIRMENTS: decreased knowledge of condition, decreased ROM, decreased safety awareness, increased fascial restrictions, impaired UE functional use, and postural dysfunction.   ACTIVITY LIMITATIONS: lifting and reach over head  PARTICIPATION LIMITATIONS:  able to do all with minimal limitation  PERSONAL FACTORS: 1 comorbidity: axillary mass removal with 1 LN   are also affecting patient's functional outcome.  REHAB POTENTIAL: Excellent  CLINICAL DECISION MAKING: Stable/uncomplicated  EVALUATION COMPLEXITY: Low  GOALS: Goals reviewed with patient? Yes  SHORT TERM GOALS=LONG TERM GOALS: Target date: 01/23/2023  Pt will be independent in a HEP for shoulder ROM/strength Baseline: Goal status: MET 12/21/2022 2.  Pts left shoulder ROM will be WNL for shoulder flexion and abduction Baseline:  Goal status:Progressing  3.  Pt will be educated in precautions for lymphedema Baseline:  Goal status: MET 4.  Pt will not be bothered by left axillary cording Baseline:  Goal status: MET per phone call  5. Pt will be instructed in beginning level strengthening program   Goal Status: MET PLAN:  PT FREQUENCY: 1-2x/week  PT DURATION: 6 weeks  PLANNED INTERVENTIONS: Therapeutic  exercises, Patient/Family education, Self Care, Orthotic/Fit training, scar mobilization, Manual therapy, and Re-evaluation  PLAN FOR NEXT SESSION: give script for compression sleeve for flying,, check goals, DC or put on hold for several weeks,review NTS,MFR to left axillary region, PROM,review precautions for lymphedema,  update HEP PHYSICAL THERAPY DISCHARGE SUMMARY  Visits from Start of Care: 6  Current functional level related to goals / functional outcomes: Pt had achieved most goals established at evaluation. Pt called 01/09/2023 and indicated she is doing well and did not feel she needed to continue PT   Remaining deficits: Cording still present but not interfering with ROM   Education / Equipment: HEP, Lymphedema handout,theraband   Patient agrees to discharge. Patient goals were partially met. Patient is being discharged due to the patient's request.   Claris Pong, PT 01/02/2023, 7:54 PM

## 2023-01-09 ENCOUNTER — Ambulatory Visit: Payer: BC Managed Care – PPO

## 2023-04-03 ENCOUNTER — Ambulatory Visit: Payer: BC Managed Care – PPO

## 2023-06-14 ENCOUNTER — Emergency Department (HOSPITAL_BASED_OUTPATIENT_CLINIC_OR_DEPARTMENT_OTHER): Payer: BC Managed Care – PPO | Admitting: Radiology

## 2023-06-14 ENCOUNTER — Emergency Department (HOSPITAL_BASED_OUTPATIENT_CLINIC_OR_DEPARTMENT_OTHER)
Admission: EM | Admit: 2023-06-14 | Discharge: 2023-06-14 | Disposition: A | Discharge status: Home / Self Care | Payer: BC Managed Care – PPO | Attending: Emergency Medicine | Admitting: Emergency Medicine

## 2023-06-14 ENCOUNTER — Encounter (HOSPITAL_BASED_OUTPATIENT_CLINIC_OR_DEPARTMENT_OTHER): Payer: Self-pay

## 2023-06-14 ENCOUNTER — Other Ambulatory Visit: Payer: Self-pay

## 2023-06-14 ENCOUNTER — Other Ambulatory Visit (HOSPITAL_BASED_OUTPATIENT_CLINIC_OR_DEPARTMENT_OTHER): Payer: Self-pay

## 2023-06-14 DIAGNOSIS — D75839 Thrombocytosis, unspecified: Secondary | ICD-10-CM | POA: Diagnosis not present

## 2023-06-14 DIAGNOSIS — R079 Chest pain, unspecified: Secondary | ICD-10-CM | POA: Diagnosis not present

## 2023-06-14 LAB — CBC
HCT: 40.7 % (ref 36.0–46.0)
Hemoglobin: 13.9 g/dL (ref 12.0–15.0)
MCH: 29.4 pg (ref 26.0–34.0)
MCHC: 34.2 g/dL (ref 30.0–36.0)
MCV: 86 fL (ref 80.0–100.0)
Platelets: 419 10*3/uL — ABNORMAL HIGH (ref 150–400)
RBC: 4.73 MIL/uL (ref 3.87–5.11)
RDW: 13.1 % (ref 11.5–15.5)
WBC: 8 10*3/uL (ref 4.0–10.5)
nRBC: 0 % (ref 0.0–0.2)

## 2023-06-14 LAB — BASIC METABOLIC PANEL
Anion gap: 12 (ref 5–15)
BUN: 9 mg/dL (ref 6–20)
CO2: 23 mmol/L (ref 22–32)
Calcium: 9.8 mg/dL (ref 8.9–10.3)
Chloride: 101 mmol/L (ref 98–111)
Creatinine, Ser: 0.62 mg/dL (ref 0.44–1.00)
GFR, Estimated: >60 mL/min (ref >60)
Glucose, Bld: 105 mg/dL — ABNORMAL HIGH (ref 70–99)
Potassium: 3.7 mmol/L (ref 3.5–5.1)
Sodium: 136 mmol/L (ref 135–145)

## 2023-06-14 LAB — TROPONIN I (HIGH SENSITIVITY)
Troponin I (High Sensitivity): <2 ng/L (ref <18)
Troponin I (High Sensitivity): <2 ng/L (ref <18)

## 2023-06-14 MED ORDER — ALUM & MAG HYDROXIDE-SIMETH 200-200-20 MG/5ML PO SUSP
15.0000 mL | Freq: Once | ORAL | Status: AC
  Administered 2023-06-14: 15 mL via ORAL
  Filled 2023-06-14: qty 30

## 2023-06-14 MED ORDER — FAMOTIDINE 20 MG PO TABS
20.0000 mg | ORAL_TABLET | Freq: Once | ORAL | Status: AC
  Administered 2023-06-14: 20 mg via ORAL
  Filled 2023-06-14: qty 1

## 2023-06-14 MED ORDER — PANTOPRAZOLE SODIUM 40 MG PO TBEC: 40.0000 mg | DELAYED_RELEASE_TABLET | Freq: Every day | ORAL | 2 refills | Status: AC

## 2023-06-14 NOTE — ED Notes (Signed)
Pt given discharge instructions and reviewed prescriptions. Opportunities given for questions. Pt verbalizes understanding. PIV removed x1. Stone,Heather R, RN 

## 2023-06-14 NOTE — Discharge Instructions (Addendum)
As discussed, workup today overall reassuring.  Heart enzymes are normal.  Chest x-ray without abnormality.  EKG looked normal.  I suspect part of your symptoms are likely secondary to reflux or GERD.  Given underlying coronary artery disease on your CT studies, it is imperative that you follow-up with cardiology in the outpatient setting for further evaluation of said disease.  If you notice symptoms return especially with exertion, please return to the emergency department.  Otherwise, I have sent in referral for cardiology.  You should hear from them within the next 72 hours about appointment scheduling.  Will also begin you on reflux medicine in the form of Protonix.  See information attached regarding food/liquids that make GERD or reflux worse.  Please do not hesitate to return to emergency department for worrisome signs and symptoms we discussed become apparent.

## 2023-06-14 NOTE — ED Triage Notes (Signed)
Onset yesterday of headache and "heart burn".  Sweating and nausea.  Chest pressure.  Today tight to left side chest and pain to left shoulder

## 2023-06-14 NOTE — ED Provider Notes (Signed)
[place] [facility] AT [facility] Provider Note   CSN: [id number] Arrival date & time: 06/14/23  1210     History  Chief Complaint  Patient presents with   Chest Pain    Tammy Powell is a 52 y.o. female.   Chest Pain   52 year old female presents emergency department complaints of chest pain.  Patient reports on Monday, symptoms began in the evening at night after she ate [employer] chicken nuggets.  Reports bandlike pain sensation of her upper abdomen of which she went to bed and symptoms resolved in the morning.  Reported yesterday, [place] farm pe[place] pe[place] in the hot sun around 11 AM and began to feel nauseated, having some left-sided chest pain pain as well as a few episodes of vomiting.  States that she went home and tried to rest trying Tums as well as Tylenol which helped some with her symptoms.  States she had shortness of breath at that time.  Reported yesterday, noting some radiation of pain to left jaw as well as to left shoulder. Both shortness of breath as well as some left-sided chest pain has persisted through today prompting her visit to the emergency department.  Still complaining of some left-sided chest pain described as pressure as well as some shortness of breath.  Denies fever, chills, cough, congestion, current abdominal pain abdominal pain.  Denies history of DVT/PE, recent surgery/immobilization, known malignancy.  Patient is on hormonal therapy in the form of drospirenon-ethinyl estradiol  Past medical history significant for GERD, hyperlipidemia  Home Medications Prior to Admission medications   Medication Sig Start Date End Date Taking? Authorizing Provider  pantoprazole (PROTONIX) 40 MG tablet Take 1 tablet (40 mg total) by mouth daily. 06/14/23  Yes Sherian Maroon A, PA  drospirenone-ethinyl estradiol (YAZ) 3-0.02 MG tablet Take 1 tablet by mouth daily.    [provider]  FLUoxetine (PROZAC) 20 MG capsule Take  20 mg by mouth daily.    [provider]  rosuvastatin (CRESTOR) 10 MG tablet Take 10 mg by mouth daily.    [provider]      Allergies    Patient has no known allergies.    Review of Systems   Review of Systems  Cardiovascular:  Positive for chest pain.  All other systems reviewed and are negative.   Physical Exam Updated Vital Signs BP 110/72   Pulse 70   Temp 98.3 F (36.8 C)   Resp 15   Ht 5' 5.5" (1.664 m)   Wt 81.6 kg   LMP 06/14/2023 (Exact Date)   SpO2 98%   BMI 29.50 kg/m  Physical Exam Vitals and nursing note reviewed.  Constitutional:      General: She is not in acute distress.    Appearance: She is well-developed.  HENT:     Head: Normocephalic and atraumatic.  Eyes:     Conjunctiva/sclera: Conjunctivae normal.  Cardiovascular:     Rate and Rhythm: Normal rate and regular rhythm.     Heart sounds: No murmur heard. Pulmonary:     Effort: Pulmonary effort is normal. No respiratory distress.     Breath sounds: Normal breath sounds. No wheezing, rhonchi or rales.  Abdominal:     Palpations: Abdomen is soft.     Tenderness: There is no abdominal tenderness. There is no guarding.  Musculoskeletal:        General: No swelling.     Cervical back: Neck supple.     Right  lower leg: No edema.     Left lower leg: No edema.  Skin:    General: Skin is warm and dry.     Capillary Refill: Capillary refill takes less than 2 seconds.  Neurological:     Mental Status: She is alert.  Psychiatric:        Mood and Affect: Mood normal.     ED Results / Procedures / Treatments   Labs (all labs ordered are listed, but only abnormal results are displayed) Labs Reviewed  BASIC METABOLIC PANEL - Abnormal; Notable for the following components:      Result Value   Glucose, Bld 105 (*)    All other components within normal limits  CBC - Abnormal; Notable for the following components:   Platelets 419 (*)    All other components within normal  limits  PREGNANCY, URINE  TROPONIN I (HIGH SENSITIVITY)  TROPONIN I (HIGH SENSITIVITY)    EKG EKG Interpretation Date/Time:  Wednesday June 14 2023 12:22:38 EDT Ventricular Rate:  86 PR Interval:  136 QRS Duration:  82 QT Interval:  390 QTC Calculation: 466 R Axis:   21  Text Interpretation: Normal sinus rhythm No previous ECGs available Confirmed by Alvester Chou [phone]) on 06/14/2023 12:30:36 PM  Radiology DG Chest 2 View  Result Date: 06/14/2023 CLINICAL DATA:  Chest pain EXAM: CHEST - 2 VIEW COMPARISON:  None Available. FINDINGS: The heart size and mediastinal contours are within normal limits. Both lungs are clear. The visualized skeletal structures are unremarkable. IMPRESSION: No active cardiopulmonary disease. Electronically Signed   By: Larose Hires D.O.   On: 06/14/2023 13:06    Procedures Procedures    Medications Ordered in ED Medications  famotidine (PEPCID) tablet 20 mg (20 mg Oral Given 06/14/23 1418)  alum & mag hydroxide-simeth (MAALOX/MYLANTA) 200-200-20 MG/5ML suspension 15 mL (15 mLs Oral Given 06/14/23 1418)    ED Course/ Medical Decision Making/ A&P                              Medical Decision Making Amount and/or Complexity of Data Reviewed Labs: ordered. Radiology: ordered.  Risk OTC drugs. Prescription drug management.   This patient presents to the ED for concern of chest pain, shortness of breath, this involves an extensive number of treatment options, and is a complaint that carries with it a high risk of complications and morbidity.  The differential diagnosis includes ACS, PE, pneumothorax, pericarditis/myocarditis/tamponade, aortic dissection, COPD, asthma, GERD, gastritis   Co morbidities that complicate the patient evaluation  See HPI   Additional history obtained:  Additional history obtained from EMR External records from outside source obtained and reviewed including hospital records.  CT cardiac scoring from 04/28/2020 did  show 76.8 coronary calcium score of LAD with total agatston score of 94.7.   Lab Tests:  I Ordered, and personally interpreted labs.  The pertinent results include: No leukocytosis.  No evidence of anemia.  Thrombocytosis of 419.  No electrolyte abnormalities.  No renal dysfunction.  Initial troponin of less than 2 with repeat less than 2   Imaging Studies ordered:  I ordered imaging studies including chest x-ray I independently visualized and interpreted imaging which showed no acute cardiopulmonary abnormalities I agree with the radiologist interpretation   Cardiac Monitoring: / EKG:  The patient was maintained on a cardiac monitor.  I personally viewed and interpreted the cardiac monitored which showed an underlying rhythm of: Normal sinus rhythm  Consultations Obtained:  N/a   Problem List / ED Course / Critical interventions / Medication management  Chest pain I ordered medication including Pepcid, Maalox   Reevaluation of the patient after these medicines showed that the patient resolved I have reviewed the patients home medicines and have made adjustments as needed   Social Determinants of Health:  Denies tobacco, illicit drug use   Test / Admission - Considered:  Chest pain Vitals signs within normal range and stable throughout visit. Laboratory/imaging studies significant for: See above 52 year old female presents emergency department with complaints of chest pain.  First episode of chest pain occurred after eating [employer] chicken nuggets with pain in the upper abdomen with radiation to the chest.  Symptoms relieved with rest.  Additional episode occurred yesterday when she was at her house peeling peaches.  Reported having some shortness of breath at that time with some radiation to left neck/left shoulder.  Patient's cardiac workup today overall reassuring from acute perspective.  EKG without acute ischemic changes, delta negative troponins.  Given patient's  rather concerning CT cardiac scoring from 04/28/2020 showing Agatston score of 94.7, exertional related chest pain yesterday, discussion was had with patient regarding admission for high risk chest pain versus outpatient follow-up.  Given resolution of symptoms with administration of GI cocktail, patient preferred outpatient follow-up with a cardiologist.  Will send an urgent cardiologist referral and recommend strict return precautions if chest pain returns.  Will place patient on PPI as well as give nitroglycerin if recurrence of symptoms occurs.  Lower suspicion for PE given lack of tachycardia, pleuritic chest pain, hypoxia.  Doubt dissection, pneumonia, pneumothorax, pericarditis/myocarditis/tamponade.  Treatment plan discussed at length with patient and she acknowledged understanding was agreeable to the plan.  Patient overall well-appearing, afebrile in no acute distress. Worrisome signs and symptoms were discussed with the patient, and the patient acknowledged understanding to return to the ED if noticed. Patient was stable upon discharge.          Final Clinical Impression(s) / ED Diagnoses Final diagnoses:  Chest pain, unspecified type    Rx / DC Orders ED Discharge Orders          Ordered    Ambulatory referral to Cardiology        06/14/23 1539    pantoprazole (PROTONIX) 40 MG tablet  Daily        06/14/23 1540              Peter Garter, [place] 06/14/23 1824    Sloan Leiter, DO 06/16/23 1514

## 2023-08-14 NOTE — Progress Notes (Signed)
Referring-Cooper Sherral Hammers, [place] Reason for referral-chest pain  HPI: 52 year old female for evaluation of chest pain at request of Sherian Maroon, [place].  Calcium score June 2021 94.7 which was 99th percentile.  Seen with chest pain July 2024 in the emergency room.  Troponins were normal.  Hemoglobin 13.9.  Sodium 136, potassium 3.7, creatinine 0.62.  Cardiology asked to evaluate.  Patient states that at the time of her emergency room evaluation she had developed chest tightness when working.  The pain radiated to her left upper extremity and jaw.  It lasted hours.  She went to the emergency room the next day and as outlined above her troponins were normal.  She did not have associated dyspnea, nausea or diaphoresis.  She has had occasional vague chest tightness with stress since that time.  There is no dyspnea on exertion, orthopnea, PND or pedal edema.  No syncope.  Current Outpatient Medications  Medication Sig Dispense Refill   pantoprazole (PROTONIX) 40 MG tablet Take 1 tablet (40 mg total) by mouth daily. 30 tablet 2   rosuvastatin (CRESTOR) 10 MG tablet Take 10 mg by mouth daily.     drospirenone-ethinyl estradiol (YAZ) 3-0.02 MG tablet Take 1 tablet by mouth daily. (Patient not taking: Reported on 08/25/2023)     FLUoxetine (PROZAC) 20 MG capsule Take 20 mg by mouth daily.     No current facility-administered medications for this visit.    No Known Allergies   Past Medical History:  Diagnosis Date   Hyperlipidemia     Past Surgical History:  Procedure Laterality Date   BREAST EXCISIONAL BIOPSY Right    CESAREAN SECTION      Social History   Socioeconomic History   Marital status: Married    Spouse name: Not on file   Number of children: 2   Years of education: Not on file   Highest education level: Not on file  Occupational History   Not on file  Tobacco Use   Smoking status: Never   Smokeless tobacco: Never  Substance and Sexual Activity   Alcohol use: Yes     Comment: Occasional   Drug use: Not on file   Sexual activity: Not on file  Other Topics Concern   Not on file  Social History Narrative   Not on file   Social Determinants of Health   Financial Resource Strain: Not on file  Food Insecurity: Not on file  Transportation Needs: Not on file  Physical Activity: Not on file  Stress: Not on file  Social Connections: Not on file  Intimate Partner Violence: Not on file    Family History  Problem Relation Age of Onset   Coronary artery disease Father    Ovarian cancer Paternal Grandmother        dx 67s   Skin cancer Maternal Aunt        unknown type; surgery only   Skin cancer Maternal Uncle        unknown type; surgery only   Breast cancer Paternal Aunt 51   Prostate cancer Paternal Uncle        mets; dx late 82s    ROS: no fevers or chills, productive cough, hemoptysis, dysphasia, odynophagia, melena, hematochezia, dysuria, hematuria, rash, seizure activity, orthopnea, PND, pedal edema, claudication. Remaining systems are negative.  Physical Exam:   Blood pressure 112/76, pulse 79, height 5\' 6"  (1.676 m), weight 183 lb (83 kg), SpO2 97%.  General:  Well developed/well nourished in NAD Skin warm/dry  Patient not depressed No peripheral clubbing Back-normal HEENT-normal/normal eyelids Neck supple/normal carotid upstroke bilaterally; no bruits; no JVD; no thyromegaly chest - CTA/ normal expansion CV - RRR/normal S1 and S2; no murmurs, rubs or gallops;  PMI nondisplaced Abdomen -NT/ND, no HSM, no mass, + bowel sounds, no bruit 2+ femoral pulses, no bruits Ext-no edema, chords, 2+ DP Neuro-grossly nonfocal  ECG -June 14, 2023-normal sinus rhythm with no ST changes.  Personally reviewed ECG today shows NSR, no ST changes A/P  1 chest pain-symptoms have both typical and atypical features.  She has a strong family history of coronary artery disease.  I will arrange a cardiac CTA to rule out obstructive disease.  2 coronary  calcification-previously noted to have elevated calcium score.  Add aspirin 81 mg daily.  Increase Crestor to 40 mg daily.  Check lipids and liver in 8 weeks.  3 hyperlipidemia-given documented coronary artery disease will increase Crestor to 40 mg daily and check lipids and liver in 8 weeks.  Olga Millers, MD

## 2023-08-25 ENCOUNTER — Ambulatory Visit: Payer: BC Managed Care – PPO | Attending: Cardiology | Admitting: Cardiology

## 2023-08-25 ENCOUNTER — Encounter: Payer: Self-pay | Admitting: Cardiology

## 2023-08-25 VITALS — BP 112/76 | HR 79 | Ht 66.0 in | Wt 183.0 lb

## 2023-08-25 DIAGNOSIS — I2510 Atherosclerotic heart disease of native coronary artery without angina pectoris: Secondary | ICD-10-CM

## 2023-08-25 DIAGNOSIS — R072 Precordial pain: Secondary | ICD-10-CM | POA: Diagnosis not present

## 2023-08-25 DIAGNOSIS — Z136 Encounter for screening for cardiovascular disorders: Secondary | ICD-10-CM | POA: Diagnosis not present

## 2023-08-25 MED ORDER — ASPIRIN 81 MG PO TBEC
81.0000 mg | DELAYED_RELEASE_TABLET | Freq: Every day | ORAL | Status: AC
Start: 2023-08-25 — End: ?

## 2023-08-25 MED ORDER — METOPROLOL TARTRATE 100 MG PO TABS
ORAL_TABLET | ORAL | 0 refills | Status: AC
Start: 2023-08-25 — End: ?

## 2023-08-25 MED ORDER — ROSUVASTATIN CALCIUM 40 MG PO TABS
40.0000 mg | ORAL_TABLET | Freq: Every day | ORAL | 3 refills | Status: DC
Start: 2023-08-25 — End: 2024-08-20

## 2023-08-25 NOTE — Patient Instructions (Signed)
Medication Instructions:   START ASPIRIN 81 MG ONCE DAILY  INCREASE ROSUVASTATIN TO 40 MG ONCE DAILY= 4 OF THE 10 MG TABLETS ONCE DAILY  *If you need a refill on your cardiac medications before your next appointment, please call your pharmacy*   Lab Work:  Your physician recommends that you return for lab work in: [facility]  If you have labs (blood work) drawn today and your tests are completely normal, you will receive your results only by: MyChart Message (if you have MyChart) OR A paper copy in the mail If you have any lab test that is abnormal or we need to change your treatment, we will call you to review the results.   Testing/Procedures:    Your cardiac CT will be scheduled at   [facility] [address] [place], [place] [address] [phone]    If scheduled at [facility], please arrive at the [facility] and Children's Entrance (Entrance C2) of [facility] 30 minutes prior to test start time. You can use the FREE valet parking offered at entrance C (encouraged to control the heart rate for the test)  Proceed to the [facility] Radiology Department (first floor) to check-in and test prep.  All radiology patients and guests should use entrance C2 at [facility], accessed from [facility], even though the hospital's physical address listed is [address]     Please follow these instructions carefully (unless otherwise directed):  An IV will be required for this test and Nitroglycerin will be given.    On the Night Before the Test: Be sure to Drink plenty of water. Do not consume any caffeinated/decaffeinated beverages or chocolate 12 hours prior to your test. Do not take any antihistamines 12 hours prior to your test.  On the Day of the Test: Drink plenty of water until 1 hour prior to the test. Do not eat any food 1 hour prior to test. You may take your regular medications prior to the test.   Take metoprolol (Lopressor) 100 MG two hours prior to test. FEMALES- please wear underwire-free bra if available, avoid dresses & tight clothing       After the Test: Drink plenty of water. After receiving IV contrast, you may experience a mild flushed feeling. This is normal. On occasion, you may experience a mild rash up to 24 hours after the test. This is not dangerous. If this occurs, you can take Benadryl 25 mg and increase your fluid intake. If you experience trouble breathing, this can be serious. If it is severe call 911 IMMEDIATELY. If it is mild, please call our office.   We will call to schedule your test 2-4 weeks out understanding that some insurance companies will need an authorization prior to the service being performed.   For more information and frequently asked questions, please visit our website : [email or web address]  For non-scheduling related questions, please contact the cardiac imaging nurse navigator should you have any questions/concerns: Cardiac Imaging Nurse Navigators Direct Office Dial: [phone]   For scheduling needs, including cancellations and rescheduling, please call [place], [phone].    Follow-Up: At [facility], you and your health needs are our priority.  As part of our continuing mission to provide you with exceptional heart care, we have created designated Provider Care Teams.  These Care Teams include your primary Cardiologist (physician) and Advanced Practice Providers (APPs -  Physician Assistants and Nurse Practitioners) who all work together to provide  you with the care you need, when you need it.  We recommend signing up for the patient portal called "MyChart".  Sign up information is provided on this After Visit Summary.  MyChart is used to connect with patients for Virtual Visits (Telemedicine).  Patients are able to view lab/test results, encounter notes, upcoming appointments, etc.  Non-urgent messages can  be sent to your provider as well.   To learn more about what you can do with MyChart, go to [email or web address].    Your next appointment:   12 month(s)  Provider:   Olga Millers MD

## 2023-08-31 ENCOUNTER — Encounter (HOSPITAL_COMMUNITY): Payer: Self-pay

## 2023-09-04 ENCOUNTER — Telehealth (HOSPITAL_COMMUNITY): Payer: Self-pay | Admitting: *Deleted

## 2023-09-04 NOTE — Telephone Encounter (Signed)
Reaching out to patient to offer assistance regarding upcoming cardiac imaging study; pt verbalizes understanding of appt date/time, parking situation and where to check in, pre-test NPO status and medications ordered, and verified current allergies; name and call back number provided for further questions should they arise Hayley Sharpe RN Navigator Cardiac Imaging [facility] [phone] office [phone] cell  

## 2023-09-05 ENCOUNTER — Ambulatory Visit (HOSPITAL_COMMUNITY)
Admission: RE | Admit: 2023-09-05 | Discharge: 2023-09-05 | Disposition: A | Discharge status: Home / Self Care | Payer: BC Managed Care – PPO | Source: Ambulatory Visit | Attending: Cardiology | Admitting: Cardiology

## 2023-09-05 DIAGNOSIS — R072 Precordial pain: Secondary | ICD-10-CM

## 2023-09-05 DIAGNOSIS — I2510 Atherosclerotic heart disease of native coronary artery without angina pectoris: Secondary | ICD-10-CM

## 2023-09-05 MED ORDER — METOPROLOL TARTRATE 5 MG/5ML IV SOLN
5.0000 mg | Freq: Once | INTRAVENOUS | Status: AC
  Administered 2023-09-05: 5 mg via INTRAVENOUS

## 2023-09-05 MED ORDER — METOPROLOL TARTRATE 5 MG/5ML IV SOLN
INTRAVENOUS | Status: AC
  Filled 2023-09-05: qty 10

## 2023-09-05 MED ORDER — IOHEXOL 350 MG/ML SOLN
95.0000 mL | Freq: Once | INTRAVENOUS | Status: AC | PRN
  Administered 2023-09-05: 95 mL via INTRAVENOUS

## 2023-09-05 MED ORDER — NITROGLYCERIN 0.4 MG SL SUBL
0.8000 mg | SUBLINGUAL_TABLET | Freq: Once | SUBLINGUAL | Status: AC
  Administered 2023-09-05: 0.8 mg via SUBLINGUAL

## 2023-09-05 MED ORDER — NITROGLYCERIN 0.4 MG SL SUBL
SUBLINGUAL_TABLET | SUBLINGUAL | Status: AC
  Filled 2023-09-05: qty 2

## 2023-11-27 ENCOUNTER — Encounter: Payer: Self-pay | Admitting: *Deleted

## 2024-08-20 ENCOUNTER — Other Ambulatory Visit: Payer: Self-pay | Admitting: Cardiology

## 2024-08-20 DIAGNOSIS — I2510 Atherosclerotic heart disease of native coronary artery without angina pectoris: Secondary | ICD-10-CM

## 2024-08-20 MED ORDER — ROSUVASTATIN CALCIUM 40 MG PO TABS
40.0000 mg | ORAL_TABLET | Freq: Every day | ORAL | 0 refills | Status: DC
Start: 2024-08-20 — End: 2024-09-18

## 2024-09-17 ENCOUNTER — Other Ambulatory Visit: Payer: Self-pay | Admitting: Cardiology

## 2024-09-17 DIAGNOSIS — I2510 Atherosclerotic heart disease of native coronary artery without angina pectoris: Secondary | ICD-10-CM

## 2024-09-23 ENCOUNTER — Encounter: Payer: Self-pay | Admitting: Neurology

## 2024-09-23 ENCOUNTER — Ambulatory Visit: Admitting: Neurology

## 2024-09-23 VITALS — BP 110/71 | HR 72 | Ht 65.0 in | Wt 188.0 lb

## 2024-09-23 DIAGNOSIS — Z9189 Other specified personal risk factors, not elsewhere classified: Secondary | ICD-10-CM

## 2024-09-23 DIAGNOSIS — R351 Nocturia: Secondary | ICD-10-CM | POA: Diagnosis not present

## 2024-09-23 DIAGNOSIS — R519 Headache, unspecified: Secondary | ICD-10-CM

## 2024-09-23 DIAGNOSIS — E66811 Obesity, class 1: Secondary | ICD-10-CM

## 2024-09-23 DIAGNOSIS — R0683 Snoring: Secondary | ICD-10-CM

## 2024-09-23 DIAGNOSIS — G4719 Other hypersomnia: Secondary | ICD-10-CM

## 2024-09-23 NOTE — Progress Notes (Signed)
 Subjective:    Patient ID: Tammy Powell is a 53 y.o. female.  HPI    True Mar, MD, PhD [facility] [address], [address] [address] [place], [place] [address]  Dear Dr. Onita,  I saw your patient, Coralie Faraj, upon your kind request in my sleep clinic today for initial consultation of her sleep disorder, in particular, concern for underlying obstructive sleep apnea.  The patient is unaccompanied today.  As you know, Ms. Robey is a 53 year old female with an underlying medical history of hyperlipidemia, coronary artery calcification, granuloma annulare, allergic rhinitis, and obesity, who reports snoring and excessive daytime somnolence.  Her Epworth sleepiness score is 12 out of 24, fatigue severity score is 40 out of 63.  I reviewed your office note from 07/25/2024.  Her snoring has become worse over time.  She has also had weight fluctuation, gained weight in the recent past.  She just saw dermatology today and has been started on methotrexate and dapsone for her skin lesion/diagnosis of granuloma annulare.  She lives with her husband and 2 children.  She works as a runner, broadcasting/film/video.  Bedtime is generally around 9 and rise time around 5:30 AM.  She is not aware of any family history of sleep apnea.  She drinks caffeine in the form of coffee, about 3 cups/day alcohol occasionally, she is a non-smoker.  She has had rare morning headaches and nocturia about once per average night.  She denies any restless leg symptoms.  She does not sleep well through the night but does not have much in the way of difficulty falling asleep.  They have no pets in the household.  Her Past Medical History Is Significant For: Past Medical History:  Diagnosis Date   Hyperlipidemia     Her Past Surgical History Is Significant For: Past Surgical History:  Procedure Laterality Date   BREAST EXCISIONAL BIOPSY Right    CESAREAN SECTION      Her Family History Is Significant For: Family  History  Problem Relation Age of Onset   Coronary artery disease Father    Skin cancer Maternal Aunt        unknown type; surgery only   Skin cancer Maternal Uncle        unknown type; surgery only   Breast cancer Paternal Aunt 21   Prostate cancer Paternal Uncle        mets; dx late 28s   Ovarian cancer Paternal Grandmother        dx 66s   Sleep apnea Neg Hx     Her Social History Is Significant For: Social History   Socioeconomic History   Marital status: Married    Spouse name: Not on file   Number of children: 2   Years of education: Not on file   Highest education level: Not on file  Occupational History   Not on file  Tobacco Use   Smoking status: Never   Smokeless tobacco: Never  Vaping Use   Vaping status: Never Used  Substance and Sexual Activity   Alcohol use: Yes    Comment: Occasional   Drug use: Not on file   Sexual activity: Not on file  Other Topics Concern   Not on file  Social History Narrative   Not on file   Social Drivers of Health   Financial Resource Strain: Not on file  Food Insecurity: Not on file  Transportation Needs: Not on file  Physical Activity: Not on file  Stress:  Not on file  Social Connections: Not on file    Her Allergies Are:  No Known Allergies:   Her Current Medications Are:  Outpatient Encounter Medications as of 09/23/2024  Medication Sig   aspirin  EC 81 MG tablet Take 1 tablet (81 mg total) by mouth daily. Swallow whole.   dapsone 25 MG tablet Take 50 mg by mouth daily.   drospirenone-ethinyl estradiol (YAZ) 3-0.02 MG tablet Take 1 tablet by mouth daily.   FLUoxetine (PROZAC) 20 MG capsule Take 20 mg by mouth daily.   folic acid (FOLVITE) 1 MG tablet Take 1 mg by mouth daily.   methotrexate (RHEUMATREX) 2.5 MG tablet Take by mouth.   rosuvastatin  (CRESTOR ) 40 MG tablet TAKE 1 TABLET BY MOUTH DAILY   metoprolol  tartrate (LOPRESSOR ) 100 MG tablet TAKE 2 HOURS PRIOR TO CT SCAN   pantoprazole  (PROTONIX ) 40 MG tablet  Take 1 tablet (40 mg total) by mouth daily.   No facility-administered encounter medications on file as of 09/23/2024.  :   Review of Systems:  Out of a complete 14 point review of systems, all are reviewed and negative with the exception of these symptoms as listed below:  Review of Systems  Objective:  Neurological Exam  Physical Exam Physical Examination:   Vitals:   09/23/24 1407  BP: 110/71  Pulse: 72    General Examination: The patient is a very pleasant 53 y.o. female in no acute distress. She appears well-developed and well-nourished and well groomed.   HEENT: Normocephalic, atraumatic, pupils are equal, round and reactive to light, extraocular tracking is good without limitation to gaze excursion or nystagmus noted. No photophobia.  No Corrective eye glasses in place. Hearing is grossly intact.  Face is symmetric with normal facial animation. Speech is clear without dysarthria. There is no hypophonia. There is no lip, neck/head, jaw or voice tremor. Neck is supple with full range of passive and active motion. There are no carotid bruits on auscultation.  Airway/Oropharynx exam reveals: Mild mouth dryness, good dental hygiene and mild airway crowding, due to small airway entry, uvula elongated, tonsils on the smaller side, right side easier to see compared to left.  Tongue root centrally and palate elevates symmetrically, neck circumference 14-5/8 inches, minimal overbite noted.  Chest: Clear to auscultation without wheezing, rhonchi or crackles noted.  Heart: S1+S2+0, regular and normal without murmurs, rubs or gallops noted.   Abdomen: Soft, non-tender and non-distended.  Extremities: There is no pitting edema in the distal lower extremities bilaterally.   Skin: Warm and dry with a hyperpigmented rash, confluent areas throughout the lower extremities primarily.    Musculoskeletal: exam reveals no obvious joint deformities.   Neurologically:  Mental status: The  patient is awake, alert and oriented in all 4 spheres. Her immediate and remote memory, attention, language skills and fund of knowledge are appropriate. There is no evidence of aphasia, agnosia, apraxia or anomia. Speech is clear with normal prosody and enunciation. Thought process is linear. Mood is normal and affect is normal.  Cranial nerves II - XII are as described above under HEENT exam.  Motor exam: Normal bulk, moving all 4 extremities without obvious restriction, no obvious action or resting tremor.  Fine motor skills and coordination: Intact grossly.  Cerebellar testing: No dysmetria or intention tremor. There is no truncal or gait ataxia.  Sensory exam: intact to light touch in the upper and lower extremities.  Gait, station and balance: She stands easily. No veering to one side is noted.  No leaning to one side is noted. Posture is age-appropriate and stance is narrow based. Gait shows normal stride length and normal pace. No problems turning are noted.   Assessment and Plan:  In summary, Lichelle M Hibner is a very pleasant 52 y.o.-year old female with an underlying medical history of hyperlipidemia, coronary artery calcification, granuloma annulare, allergic rhinitis, and obesity, whose history and physical exam are concerning for sleep disordered breathing, particularly obstructive sleep apnea (OSA). A laboratory attended sleep study is typically considered gold standard for evaluation of sleep disordered breathing.   I had a long chat with the patient about my findings and the diagnosis of sleep apnea, particularly OSA, its prognosis and treatment options. We talked about medical/conservative treatments, surgical interventions and non-pharmacological approaches for symptom control. I explained, in particular, the risks and ramifications of untreated moderate to severe OSA, especially with respect to developing cardiovascular disease down the road, including congestive heart failure (CHF),  difficult to treat hypertension, cardiac arrhythmias (particularly A-fib), neurovascular complications including TIA, stroke and dementia. Even type 2 diabetes has, in part, been linked to untreated OSA. Symptoms of untreated OSA may include (but may not be limited to) daytime sleepiness, nocturia (i.e. frequent nighttime urination), memory problems, mood irritability and suboptimally controlled or worsening mood disorder such as depression and/or anxiety, lack of energy, lack of motivation, physical discomfort, as well as recurrent headaches, especially morning or nocturnal headaches. We talked about the importance of maintaining a healthy lifestyle and striving for healthy weight. In addition, we talked about the importance of striving for and maintaining good sleep hygiene. I recommended a sleep study at this time. I outlined the differences between a laboratory attended sleep study which is considered more comprehensive and accurate over the option of a home sleep test (HST); the latter may lead to underestimation of sleep disordered breathing in some instances and does not help with diagnosing upper airway resistance syndrome and is not accurate enough to diagnose primary central sleep apnea typically. I outlined possible surgical and non-surgical treatment options of OSA, including the use of a positive airway pressure (PAP) device (i.e. CPAP, AutoPAP/APAP or BiPAP in certain circumstances), a custom-made dental device (aka oral appliance, which would require a referral to a specialist dentist or orthodontist typically, and is generally speaking not considered for patients with full dentures or edentulous state), upper airway surgical options, such as traditional UPPP (which is not considered a first-line treatment) or the Inspire device (hypoglossal nerve stimulator, which would involve a referral for consultation with an ENT surgeon, after careful selection, following inclusion criteria - also not  first-line treatment). I explained the PAP treatment option to the patient in detail, as this is generally considered first-line treatment.  The patient indicated that she would be willing to try PAP therapy, if the need arises.    We will pick up our discussion about the next steps and treatment options after testing.  We will keep her posted as to the test results by phone call and/or MyChart messaging where possible.  We will plan to follow-up in sleep clinic accordingly as well.  I answered all her questions today and the patient was in agreement.   I encouraged her to call with any interim questions, concerns, problems or updates or email us  through MyChart.  Generally speaking, sleep test authorizations may take up to 2 weeks, sometimes less, sometimes longer, the patient is encouraged to get in touch with us  if they do not hear back from the  sleep lab staff directly within the next 2 weeks.  Thank you very much for allowing me to participate in the care of this nice patient. If I can be of any further assistance to you please do not hesitate to call me at [phone].  Sincerely,   True Mar, MD, PhD

## 2024-09-23 NOTE — Patient Instructions (Signed)

## 2024-09-26 ENCOUNTER — Telehealth: Payer: Self-pay | Admitting: Neurology

## 2024-09-26 NOTE — Telephone Encounter (Signed)
 09/25/24 [facility] [place] 09/24/24 [employer] state no auth req EE

## 2024-10-01 ENCOUNTER — Other Ambulatory Visit: Payer: Self-pay | Admitting: Cardiology

## 2024-10-01 DIAGNOSIS — I2510 Atherosclerotic heart disease of native coronary artery without angina pectoris: Secondary | ICD-10-CM
